# Patient Record
Sex: Female | Born: 1985
Health system: Southern US, Community
[De-identification: ages and names within clinical notes are randomized; demographics above are authoritative.]

## PROBLEM LIST (undated history)

## (undated) DIAGNOSIS — L309 Dermatitis, unspecified: Secondary | ICD-10-CM

## (undated) DIAGNOSIS — I493 Ventricular premature depolarization: Secondary | ICD-10-CM

## (undated) DIAGNOSIS — K219 Gastro-esophageal reflux disease without esophagitis: Secondary | ICD-10-CM

## (undated) DIAGNOSIS — R51 Headache: Secondary | ICD-10-CM

## (undated) DIAGNOSIS — O24419 Gestational diabetes mellitus in pregnancy, unspecified control: Secondary | ICD-10-CM

## (undated) DIAGNOSIS — Q210 Ventricular septal defect: Secondary | ICD-10-CM

## (undated) DIAGNOSIS — J309 Allergic rhinitis, unspecified: Secondary | ICD-10-CM

## (undated) DIAGNOSIS — A609 Anogenital herpesviral infection, unspecified: Secondary | ICD-10-CM

## (undated) HISTORY — DX: Headache: R51

## (undated) HISTORY — DX: Gestational diabetes mellitus in pregnancy, unspecified control: O24.419

## (undated) HISTORY — PX: CHOLECYSTECTOMY: SHX55

## (undated) HISTORY — DX: Ventricular premature depolarization: I49.3

## (undated) HISTORY — DX: Anogenital herpesviral infection, unspecified: A60.9

## (undated) HISTORY — DX: Gastro-esophageal reflux disease without esophagitis: K21.9

## (undated) HISTORY — DX: Allergic rhinitis, unspecified: J30.9

## (undated) HISTORY — DX: Dermatitis, unspecified: L30.9

## (undated) HISTORY — DX: Ventricular septal defect: Q21.0

---
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
## 2003-02-07 ENCOUNTER — Encounter: Payer: Self-pay | Admitting: Internal Medicine

## 2003-02-07 ENCOUNTER — Ambulatory Visit (HOSPITAL_COMMUNITY): Admission: RE | Admit: 2003-02-07 | Discharge: 2003-02-07 | Payer: Self-pay | Admitting: Internal Medicine

## 2003-06-06 ENCOUNTER — Encounter: Admission: RE | Admit: 2003-06-06 | Discharge: 2003-06-06 | Payer: Self-pay | Admitting: *Deleted

## 2003-06-06 ENCOUNTER — Ambulatory Visit (HOSPITAL_COMMUNITY): Admission: RE | Admit: 2003-06-06 | Discharge: 2003-06-06 | Payer: Self-pay | Admitting: *Deleted

## 2003-06-06 ENCOUNTER — Encounter: Payer: Self-pay | Admitting: Internal Medicine

## 2003-08-27 ENCOUNTER — Encounter (INDEPENDENT_AMBULATORY_CARE_PROVIDER_SITE_OTHER): Payer: Self-pay | Admitting: *Deleted

## 2003-08-27 ENCOUNTER — Ambulatory Visit (HOSPITAL_COMMUNITY): Admission: RE | Admit: 2003-08-27 | Discharge: 2003-08-27 | Payer: Self-pay | Admitting: *Deleted

## 2003-10-22 ENCOUNTER — Other Ambulatory Visit: Admission: RE | Admit: 2003-10-22 | Discharge: 2003-10-22 | Payer: Self-pay | Admitting: Obstetrics and Gynecology

## 2004-11-20 ENCOUNTER — Observation Stay (HOSPITAL_COMMUNITY): Admission: EM | Admit: 2004-11-20 | Discharge: 2004-11-21 | Payer: Self-pay | Admitting: Emergency Medicine

## 2004-11-21 ENCOUNTER — Encounter (INDEPENDENT_AMBULATORY_CARE_PROVIDER_SITE_OTHER): Payer: Self-pay | Admitting: *Deleted

## 2004-12-10 ENCOUNTER — Other Ambulatory Visit: Admission: RE | Admit: 2004-12-10 | Discharge: 2004-12-10 | Payer: Self-pay | Admitting: Obstetrics and Gynecology

## 2004-12-11 ENCOUNTER — Other Ambulatory Visit: Admission: RE | Admit: 2004-12-11 | Discharge: 2004-12-11 | Payer: Self-pay | Admitting: Obstetrics and Gynecology

## 2007-05-09 ENCOUNTER — Ambulatory Visit: Payer: Self-pay | Admitting: Family Medicine

## 2007-05-09 DIAGNOSIS — R519 Headache, unspecified: Secondary | ICD-10-CM | POA: Insufficient documentation

## 2007-05-09 DIAGNOSIS — Z8679 Personal history of other diseases of the circulatory system: Secondary | ICD-10-CM | POA: Insufficient documentation

## 2007-05-09 DIAGNOSIS — R51 Headache: Secondary | ICD-10-CM | POA: Insufficient documentation

## 2007-05-09 DIAGNOSIS — K219 Gastro-esophageal reflux disease without esophagitis: Secondary | ICD-10-CM | POA: Insufficient documentation

## 2007-05-12 ENCOUNTER — Encounter: Payer: Self-pay | Admitting: Family Medicine

## 2007-05-15 LAB — CONVERTED CEMR LAB
ALT: 15 units/L (ref 0–35)
AST: 22 units/L (ref 0–37)
Albumin: 3.6 g/dL (ref 3.5–5.2)
Alkaline Phosphatase: 71 units/L (ref 39–117)
BUN: 4 mg/dL — ABNORMAL LOW (ref 6–23)
Basophils Absolute: 0.1 10*3/uL (ref 0.0–0.1)
Basophils Relative: 0.7 % (ref 0.0–1.0)
Bilirubin, Direct: 0.1 mg/dL (ref 0.0–0.3)
CO2: 26 meq/L (ref 19–32)
Calcium: 9.5 mg/dL (ref 8.4–10.5)
Chloride: 102 meq/L (ref 96–112)
Creatinine, Ser: 0.6 mg/dL (ref 0.4–1.2)
Eosinophils Absolute: 0.3 10*3/uL (ref 0.0–0.6)
Eosinophils Relative: 3.3 % (ref 0.0–5.0)
GFR calc Af Amer: 159 mL/min
GFR calc non Af Amer: 132 mL/min
Glucose, Bld: 81 mg/dL (ref 70–99)
HCT: 38.7 % (ref 36.0–46.0)
Hemoglobin: 13.3 g/dL (ref 12.0–15.0)
Lymphocytes Relative: 25.3 % (ref 12.0–46.0)
MCHC: 34.5 g/dL (ref 30.0–36.0)
MCV: 87.1 fL (ref 78.0–100.0)
Monocytes Absolute: 0.7 10*3/uL (ref 0.2–0.7)
Monocytes Relative: 7.4 % (ref 3.0–11.0)
Neutro Abs: 5.7 10*3/uL (ref 1.4–7.7)
Neutrophils Relative %: 63.3 % (ref 43.0–77.0)
Platelets: 342 10*3/uL (ref 150–400)
Potassium: 4.9 meq/L (ref 3.5–5.1)
RBC: 4.44 M/uL (ref 3.87–5.11)
RDW: 11.6 % (ref 11.5–14.6)
Sodium: 137 meq/L (ref 135–145)
TSH: 1.62 microintl units/mL (ref 0.35–5.50)
Total Bilirubin: 0.4 mg/dL (ref 0.3–1.2)
Total Protein: 7 g/dL (ref 6.0–8.3)
WBC: 9.1 10*3/uL (ref 4.5–10.5)

## 2007-11-01 ENCOUNTER — Telehealth: Payer: Self-pay | Admitting: Family Medicine

## 2007-11-27 ENCOUNTER — Ambulatory Visit: Payer: Self-pay | Admitting: Family Medicine

## 2007-11-27 DIAGNOSIS — J309 Allergic rhinitis, unspecified: Secondary | ICD-10-CM | POA: Insufficient documentation

## 2008-03-13 ENCOUNTER — Telehealth: Payer: Self-pay | Admitting: Family Medicine

## 2009-09-16 ENCOUNTER — Ambulatory Visit: Payer: Self-pay | Admitting: Family Medicine

## 2009-09-16 DIAGNOSIS — R5383 Other fatigue: Secondary | ICD-10-CM

## 2009-09-16 DIAGNOSIS — R5381 Other malaise: Secondary | ICD-10-CM | POA: Insufficient documentation

## 2009-09-16 DIAGNOSIS — Q210 Ventricular septal defect: Secondary | ICD-10-CM | POA: Insufficient documentation

## 2009-09-16 DIAGNOSIS — L259 Unspecified contact dermatitis, unspecified cause: Secondary | ICD-10-CM | POA: Insufficient documentation

## 2009-09-16 LAB — CONVERTED CEMR LAB
Bilirubin Urine: NEGATIVE
Glucose, Urine, Semiquant: NEGATIVE
Ketones, urine, test strip: NEGATIVE
Urobilinogen, UA: 0.2
WBC Urine, dipstick: NEGATIVE
pH: 7

## 2009-09-18 LAB — CONVERTED CEMR LAB
ALT: 16 units/L (ref 0–35)
AST: 19 units/L (ref 0–37)
Alkaline Phosphatase: 53 units/L (ref 39–117)
Basophils Absolute: 0.5 10*3/uL — ABNORMAL HIGH (ref 0.0–0.1)
Basophils Relative: 4.9 % — ABNORMAL HIGH (ref 0.0–3.0)
Bilirubin, Direct: 0.1 mg/dL (ref 0.0–0.3)
CO2: 28 meq/L (ref 19–32)
GFR calc non Af Amer: 159.29 mL/min (ref >60)
Glucose, Bld: 99 mg/dL (ref 70–99)
HCT: 38 % (ref 36.0–46.0)
Hemoglobin: 12.8 g/dL (ref 12.0–15.0)
MCHC: 33.6 g/dL (ref 30.0–36.0)
MCV: 90 fL (ref 78.0–100.0)
Neutro Abs: 6 10*3/uL (ref 1.4–7.7)
Neutrophils Relative %: 60.4 % (ref 43.0–77.0)
Vitamin B-12: 257 pg/mL (ref 211–911)
WBC: 9.9 10*3/uL (ref 4.5–10.5)

## 2009-10-06 ENCOUNTER — Ambulatory Visit: Payer: Self-pay | Admitting: Internal Medicine

## 2010-04-14 ENCOUNTER — Encounter (INDEPENDENT_AMBULATORY_CARE_PROVIDER_SITE_OTHER): Payer: Self-pay | Admitting: *Deleted

## 2010-05-26 ENCOUNTER — Encounter: Payer: Self-pay | Admitting: Internal Medicine

## 2010-05-26 ENCOUNTER — Ambulatory Visit (HOSPITAL_COMMUNITY): Admission: RE | Admit: 2010-05-26 | Discharge: 2010-05-26 | Payer: Self-pay | Admitting: Internal Medicine

## 2010-05-26 ENCOUNTER — Ambulatory Visit: Payer: Self-pay | Admitting: Internal Medicine

## 2010-05-26 ENCOUNTER — Telehealth: Payer: Self-pay | Admitting: Internal Medicine

## 2010-05-26 ENCOUNTER — Telehealth (INDEPENDENT_AMBULATORY_CARE_PROVIDER_SITE_OTHER): Payer: Self-pay | Admitting: *Deleted

## 2010-05-26 ENCOUNTER — Ambulatory Visit: Payer: Self-pay

## 2010-05-27 ENCOUNTER — Telehealth: Payer: Self-pay | Admitting: Internal Medicine

## 2010-06-01 ENCOUNTER — Telehealth: Payer: Self-pay | Admitting: Internal Medicine

## 2010-06-01 ENCOUNTER — Telehealth (INDEPENDENT_AMBULATORY_CARE_PROVIDER_SITE_OTHER): Payer: Self-pay | Admitting: *Deleted

## 2010-06-02 ENCOUNTER — Ambulatory Visit: Payer: Self-pay | Admitting: Internal Medicine

## 2010-08-17 ENCOUNTER — Encounter: Payer: Self-pay | Admitting: Family Medicine

## 2010-08-27 NOTE — Procedures (Signed)
Summary: Summary Report  Summary Report   Imported By: Erle Crocker 08/03/2010 16:11:48  _____________________________________________________________________  External Attachment:    Type:   Image     Comment:   External Document

## 2010-08-27 NOTE — Progress Notes (Signed)
Summary: event monitor 06/02/10  Phone Note Outgoing Call Call back at [facility] Phone [phone]   Call placed by: Stanton Kidney, EMT-P,  June 01, 2010 1:39 PM Call placed to: Patient Action Taken: Phone Call Completed, Appt scheduled Summary of Call: event monitor scheduled11/8/11 at 4pm. 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

## 2010-08-27 NOTE — Assessment & Plan Note (Signed)
Summary: np6/congenital ventricular septal defect   Visit Type:  Initial Consult Primary Provider:  Nelwyn Salisbury MD  CC:  fatigue- pt has had some dizziness since she has been taking cold medicine.  History of Present Illness: Patient is a 25 year old with a history of a small VSD.  She was followed by Larinda Buttery in the past, last seen in 2005.  She is currently followed by Dr. Clent Ridges.  She is referred for continued cardiac evaluation On talking to the patient she denies chest pain.  Breathing is ok.  She has noted over the past year increased fatigue.  She is out of school and working .  When she gets out of work, she ust doesn't feel like doing anything.  Sleeps about  8 to 9 hrs per day.  Denies illnesses now or prior to feeling fatigued.  NO significant dizziness.  No syncope.  No palpitations.  Current Medications (verified): 1)  Sprintec 28 0.25-35 Mg-Mcg Tabs (Norgestimate-Eth Estradiol) .Marland Kitchen.. 1 By Mouth Once Daily 2)  Triamcinolone Acetonide 0.1 % Lotn (Triamcinolone Acetonide) .... Apply Three Times A Day As Needed Rash 3)  Alka-Seltzer Plus Cold 2-7.8-325 Mg Tbef (Chlorphen-Phenyleph-Asa) .... Pt Has A Cold At This Present Time  Allergies (verified): 1)  ! 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  Negative to the above problem except as noted above.  Vital Signs:  Patient profile:   25 year old female Height:      63  inches Weight:      112 pounds BMI:     19.91 Pulse rate:   92 / minute Pulse (ortho):   89 / minute BP standing:   135 / 93 Cuff size:   regular  Vitals Entered By: Burnett Kanaris, CNA (October 06, 2009 10:53 AM)  Serial Vital Signs/Assessments:  Time      Position  BP       Pulse  Resp  Temp     By      Lying LA  135/93   91                    Burnett Kanaris, CNA      Sitting   132/93   95                    Burnett Kanaris, CNA      Standing  135/93   89                    Burnett Kanaris, CNA      Standing  139/93   97                    Burnett Kanaris, CNA  Comments: no dizziness  By: Burnett Kanaris, CNA  pt states she had a lttle dizziness last couple of days but she thinks it is from the cold she has. She is not having any now. By: Burnett Kanaris, CNA  Physical Exam  Additional Exam:  Patient is in NAD HEENT:  Normocephalic, atraumatic. EOMI, PERRLA.  Neck: JVP is normal. No thyromegaly. No bruits.  Lungs: clear to auscultation. No rales no wheezes.  Heart: Regular rate and rhythm. Normal S1, S2. No S3.  Gr III-IV /VI systolic murmur L sternal border.  No diastolic murmurs. PMI not displaced.  Abdomen:  Supple, nontender. Normal bowel sounds. No masses. No hepatomegaly.  Navel pierced.  Extremities:   Good distal pulses throughout. No lower extremity edema.  Musculoskeletal :moving all extremities.  Neuro:   alert and oriented x3.    EKG  Procedure date:  10/06/2009  Findings:      NSR>  92 bpm.  RBBB.  Impression & Recommendations:  Problem # 1:  VENTRICULAR SEPTAL DEFECT (ICD-745.4) Patient has a small perimembranous VSD.  Last echo was 2005. On examination today there is no diasotlic murmur to suggest AV prolapse and AI. Still, I would recomm an echo to reconfirm. Otherwise, continue ABx prophylacitically. F/U in 2 years. Orders: EKG w/ Interpretation (93000) Echocardiogram (Echo)  Problem # 2:  FATIGUE (ICD-780.79) I do  not think  the patient's fatigue is due to her cardiac abnormality.  She is not orthostatic on exam. Encouraged her to stay active.  Patient Instructions: 1)  Your physician has requested that you have an echocardiogram.  Echocardiography is a painless test that uses sound waves to create images of your heart. It provides your doctor with information about the size and shape of your heart and how well your heart's chambers and valves are working.  This procedure takes approximately one hour. There are no restrictions for this procedure. 2)  Your physician wants you to follow-up in:  2 years. You will receive a reminder letter in the mail two months in advance. If you don't receive a letter, please call our office to schedule the follow-up appointment.  Appended Document: np6/congenital ventricular septal defect Patient cancelled her echo that was scheduled for 3/30..Called patient and left message on machine to call back to reschedule.

## 2010-08-27 NOTE — Assessment & Plan Note (Signed)
Summary: Event Monitor

## 2010-08-27 NOTE — Progress Notes (Signed)
Summary: heart racing  Phone Note Outgoing Call   Call placed by: Meredith Staggers, RN,  May 26, 2010 4:09 PM Call placed to: Patient Summary of Call: called pt regarding her mess of racing heart rate.  She states that she has had 2 episodes.  The 1st happened when she was at work she felt like her heart was racing she was dizzy and felt like she was going to pass out, she layed down in the floor and rested, she states it lasted about 30 min and then she felt better.  The 2nd episode happened about a week ago, she was laying in bed and felt her heart start to race her mom came in and checked her VS she says her BP was good and her pulse was in the 90s, she states this episode lasted about 1-1 & 1/2 hours and then resolved on its own.  She couldn't pin point any cause, she felt after 1st episode she thought could have been from stress but she was not stressed and was relaxing at 2nd episode.  She did have echo today, results are not back yest.  Will send all info to Dr Tenny Craw for review   Follow-up for Phone Call        Echo looks good. I am not sure what racing represents.  I would set patient up for an event montior. Tried to call but no answer. Follow-up by: Sherrill Raring, MD, [facility],  May 27, 2010 12:44 PM     Appended Document: heart racing pt states that she has not had another episode since last week. Spoke w/her about the event monitor and she would like to procede with that. Claris Gladden, RN, BSN

## 2010-08-27 NOTE — Progress Notes (Signed)
Summary: event monitor- unable to schedule  Phone Note Outgoing Call   Call placed by: Stanton Kidney, EMT-P,  June 01, 2010 12:21 PM Summary of Call: Unable to reach pt. to schedule for 30 day event monitor (NO working numbers in chart). Stanton Kidney, EMT-P  June 01, 2010 12:21 PM

## 2010-08-27 NOTE — Letter (Signed)
Summary: Appointment - Missed  [facility], Main Office  [address]   [place], [place] [address]   Phone: [phone]  Fax: [phone]     April 14, 2010 MRN: [id number]   [facility] [address] [place], [place]  [address]   Dear Ms. PERMAR,  Our records indicate you missed your appointment on  10/22/09 to have a Echo .It is very important that we reach you to reschedule this appointment. We look forward to participating in your health care needs. Please contact us at the number listed above at your earliest convenience to reschedule this appointment.     Sincerely,  Artist

## 2010-08-27 NOTE — Progress Notes (Signed)
Summary: pt rtn your call  Phone Note Call from Patient   Caller: Patient Reason for Call: Talk to Nurse, Talk to Doctor Summary of Call: pt rtn your call Initial call taken by: Omer Jack,  June 01, 2010 12:22 PM  Follow-up for Phone Call        monitor scheduled11/8. Follow-up by: Stanton Kidney, EMT-P,  June 01, 2010 1:41 PM

## 2010-08-27 NOTE — Progress Notes (Signed)
Summary: returned call  Phone Note Call from Patient   Caller: Patient [phone] Reason for Call: Talk to Nurse Summary of Call: pt returned call from yesterday Initial call taken by: Glynda Jaeger,  May 27, 2010 1:11 PM  Follow-up for Phone Call        spoke w/pt and see notes under previous call.  Follow-up by: Claris Gladden RN,  May 27, 2010 1:27 PM

## 2010-08-27 NOTE — Progress Notes (Signed)
  Walk in Patient Form Recieved Pt left Message " Heart Racing,Shaking,Feels Faint" sent to Message Nurse. [facility]  May 26, 2010 9:08 AM.

## 2010-08-27 NOTE — Assessment & Plan Note (Signed)
Summary: CONSTANT FATIGUE // RS   Vital Signs:  Patient profile:   25 year old female Weight:      112 pounds BMI:     19.91 Temp:     98.5 degrees F oral BP sitting:   102 / 68  (left arm) Cuff size:   regular  Vitals Entered By: Alfred Levins, CMA (September 16, 2009 3:32 PM) CC: fatigue, rash on rt hand and feet   History of Present Illness: Here with her mother to discuss several issues. First she has felt chronic fatigue for the past year. This does not stop her from working or taking care of daily activities, but she often feels the need to take naps or go to bed early. She sleeps well and averages 8-9 hours a night. Her only med is her BCP. Second, she has had an itchy rash over her hands and feet for several weeks. using Benadryl lotion. Third, she has a congenital VSD, and she used to see Dr.  Candis Musa every 2-3 years. Now she would like to see an adult cardiologist. She has no symptoms from this, no chest pains, no SOB.   Current Medications (verified): 1)  Sprintec 28 0.25-35 Mg-Mcg Tabs (Norgestimate-Eth Estradiol) .Marland Kitchen.. 1 By Mouth Once Daily  Allergies (verified): 1)  ! Phenergan  Past History:  Past Medical History: Headache ventricular septal defect, Takes amoxicillin prior to dental appts. GERD sees Dr. Henderson Cloud for gyn exams  Past Surgical History: Reviewed history from 05/09/2007 and no changes required. Cholecystectomy 2006  Review of Systems  The patient denies anorexia, fever, weight loss, weight gain, vision loss, decreased hearing, hoarseness, chest pain, syncope, dyspnea on exertion, peripheral edema, prolonged cough, headaches, hemoptysis, abdominal pain, melena, hematochezia, severe indigestion/heartburn, hematuria, incontinence, genital sores, muscle weakness, suspicious skin lesions, transient blindness, difficulty walking, depression, unusual weight change, abnormal bleeding, enlarged lymph nodes, angioedema, breast masses, and testicular masses.     Physical Exam  General:  Well-developed,well-nourished,in no acute distress; alert,appropriate and cooperative throughout examination Neck:  No deformities, masses, or tenderness noted. Lungs:  Normal respiratory effort, chest expands symmetrically. Lungs are clear to auscultation, no crackles or wheezes. Heart:  Normal rate and regular rhythm. S1 and S2 normal without gallop, click, or rub. Has a loud harsh 3/6 holosystolic murmur loudest at the left sternal border Abdomen:  Bowel sounds positive,abdomen soft and non-tender without masses, organomegaly or hernias noted. Neurologic:  alert & oriented X3, cranial nerves II-XII intact, and gait normal.   Skin:  patches of red, macular, scaly skin on the hands and feet   Impression & Recommendations:  Problem # 1:  FATIGUE (ICD-780.79)  Orders: UA Dipstick w/o Micro (automated)  (81003) Venipuncture ([address]) TLB-BMP (Basic Metabolic Panel-BMET) (80048-METABOL) TLB-CBC Platelet - w/Differential (85025-CBCD) TLB-Hepatic/Liver Function Pnl (80076-HEPATIC) TLB-TSH (Thyroid Stimulating Hormone) (84443-TSH) TLB-B12, Serum-Total ONLY (60454-U98) T-Vitamin D (25-Hydroxy) (11914-78295) Cardiology Referral (Cardiology)  Problem # 2:  ECZEMA (ICD-692.9)  Her updated medication list for this problem includes:    Triamcinolone Acetonide 0.1 % Lotn (Triamcinolone acetonide) .Marland Kitchen... Apply three times a day as needed rash  Problem # 3:  VENTRICULAR SEPTAL DEFECT (ICD-745.4)  Complete Medication List: 1)  Sprintec 28 0.25-35 Mg-mcg Tabs (Norgestimate-eth estradiol) .Marland Kitchen.. 1 by mouth once daily 2)  Triamcinolone Acetonide 0.1 % Lotn (Triamcinolone acetonide) .... Apply three times a day as needed rash  Patient Instructions: 1)  Get labs to investigate the fatigue. Try triamcinolone on the rash. Refer her to Dr. Dietrich Pates for the VSD.  Prescriptions: TRIAMCINOLONE ACETONIDE 0.1 % LOTN (TRIAMCINOLONE ACETONIDE) apply three times a day as needed rash   #60 x 5   Entered and Authorized by:   Nelwyn Salisbury MD   Signed by:   Nelwyn Salisbury MD on 09/16/2009   Method used:   Electronically to        [facility]. [phone]* (retail)       [address]       [place], [place]  [address]       Ph: [phone]       Fax: [phone]   RxID:   [id number]   Laboratory Results   Urine Tests  Date/Time Recieved: September 16, 2009 4:43 PM  Date/Time Reported: September 16, 2009 4:43 PM   Routine Urinalysis   Color: yellow Appearance: Clear Glucose: negative   (Normal Range: Negative) Bilirubin: negative   (Normal Range: Negative) Ketone: negative   (Normal Range: Negative) Spec. Gravity: 1.015   (Normal Range: 1.003-1.035) Blood: trace-intact   (Normal Range: Negative) pH: 7.0   (Normal Range: 5.0-8.0) Protein: negative   (Normal Range: Negative) Urobilinogen: 0.2   (Normal Range: 0-1) Nitrite: negative   (Normal Range: Negative) Leukocyte Esterace: negative   (Normal Range: Negative)    Comments: Wynona Canes, CMA  September 16, 2009 4:43 PM

## 2010-09-28 ENCOUNTER — Ambulatory Visit: Payer: Self-pay | Admitting: Family Medicine

## 2010-12-11 NOTE — H&P (Signed)
Brandi Wagner, Brandi Wagner               ACCOUNT NO.:  [id number]   MEDICAL RECORD NO.:  [id number]          PATIENT TYPE:  INP   LOCATION:  5703                         FACILITY:  [facility]   PHYSICIAN:  Cherylynn Ridges, M.D.    DATE OF BIRTH:  [birth year]   DATE OF ADMISSION:  11/20/2004  DATE OF DISCHARGE:                                HISTORY & PHYSICAL   CHIEF COMPLAINT:  The patient is a 25 year old with abdominal pain in the  epigastrium and right upper quadrant who has gallstones on examination.   HISTORY OF PRESENT ILLNESS:  The patient has had the acute onset of  abdominal pain in the left lower quadrant epigastrium associated with  nausea. She has had pains like this before but had previously been diagnosed  as gastroenteritis or just an upset stomach. She came to the emergency room  and ultrasound was done which showed gallstones. Surgical consultation was  obtained. The patient also has abnormal liver function test which make this  suspicious for acute cholecystitis or biliary colic.   PAST MEDICAL HISTORY:  Only for a VSD of which she has a loud murmur. She  takes antibiotics for a dental procedure.   MEDICATIONS:  She takes no chronic medications.   ALLERGIES:  She has no known drug allergies.   PAST SURGICAL HISTORY:  No previous surgeries.   FAMILY HISTORY:  She does have a family history of gallstones with a  maternal grandmother who has had cholecystectomy in the past. She is  otherwise healthy and has no physical limitations secondary to her VSD.   REVIEW OF SYMPTOMS:  Her last bowel movement was yesterday and she is  otherwise healthy.   PHYSICAL EXAMINATION:  VITAL SIGNS:  She is afebrile. Vital signs are  stable.  HEENT:  She is normocephalic and atraumatic. Anicteric.  NECK:  Supple.  CHEST:  Clear.  CARDIOVASCULAR:  She has a very loud 4/6 murmur at the left lower sternal  border that radiates to the neck and to the axilla. She has a palpable  thrill  associated with that.  ABDOMEN:  Tender in the epigastrium, while only mildly so with no rebound or  guarding. She has an active bowel sounds. She has umbilical piercing with  jewelry in place.  RECTAL:  Deferred.  PELVIC:  Deferred.   LABORATORY DATA:  A urine HCG is negative. Fibrin degradation products are  within normal limits. A white count is 14,000. Hemoglobin 13.6, hematocrit  39.5, platelets are 374,000. Her sodium 139, potassium 3.5, chloride 106,  CO2 26, glucose 86, BUN 5, creatinine 0.6. Her SGOT is elevated at 205. Her  SGPT is elevated at 61, alkaline phosphate is within normal limits at 97.  Total bilirubin is 0.4. Her myoglobin and CK and MB are all within normal  limits. Amylase is 106 with upper limits of normal being 131,000. Lipase is  normal.   IMPRESSION:  Likely biliary colic and/or mild subacute cholecystitis in an  otherwise healthy female. She is very thin. She does not fit the normal  profile with a patient with cholelithiasis; however,  with a history of a  loud heart murmur there is a chance she could have some hemoglobin  degradation products leading into tegumented stones. This could also lead to  cholelithiasis and her symptomatic gallbladder. The patient wishes to have a  procedure done in the morning. At this time, I can make no argument for an  emergency case and as she is not septic and her pain is well controlled. We  will go ahead with a laparoscopic cholecystectomy. She will get Unasyn on  call to the OR. The patient understands the risks and benefits and wishes to  proceed. We have asked her to please remove her umbilical jewelry.      JOW/MEDQ  D:  11/20/2004  T:  11/21/2004  Job:  [id number]

## 2010-12-11 NOTE — Op Note (Signed)
Brandi Wagner, Brandi Wagner               ACCOUNT NO.:  [id number]   MEDICAL RECORD NO.:  [id number]          PATIENT TYPE:  INP   LOCATION:  5703                         FACILITY:  [facility]   PHYSICIAN:  Cherylynn Ridges, M.D.    DATE OF BIRTH:  12/30/1985   DATE OF PROCEDURE:  11/21/2004  DATE OF DISCHARGE:                                 OPERATIVE REPORT   PREOP DIAGNOSIS:  Symptomatic cholelithiasis and biliary colic.   POSTOP DIAGNOSIS:  Symptomatic cholelithiasis and biliary colic.   PROCEDURE:  Laparoscopic cholecystectomy with cholangiogram.   SURGEON:  Cherylynn Ridges, M.D.   ANESTHESIA:  General endotracheal.   ESTIMATED BLOOD LOSS:  Less than 20 mL.   COMPLICATIONS:  None.   CONDITION:  Good.   INDICATIONS FOR OPERATION:  The patient is a 25 year old female with  abdominal pain in the right upper quadrant with abnormal liver function  tests who now comes in for laparoscopic cholecystectomy.   FINDINGS:  The patient had a normal cholangiogram and only mild inflammation  of the gallbladder.   OPERATION:  The patient was taken to the operating room, placed on table in  supine position. After an adequate endotracheal general anesthetic was  administered, she was prepped and draped in usual sterile manner exposing  the midline in the right upper quadrant.  An infraumbilical curvilinear  incision was made using #11 blade and taking down to the midline fascia. It  was through this midline fascia that a Veress needle was passed into the  peritoneal cavity, up into the peritoneal cavity. It was confirmed to be in  position using the saline test. Once this was done, carbon dioxide  insufflation was instilled into the peritoneal cavity up to a maximal intra-  abdominal pressure of 15 mmHg. Once this was done, OptiVu, a 10-mm cannula,  with the inserted laparoscope and attached camera was passed through the  infraumbilical fascia into the peritoneal cavity and confirmed to be in  position with the scope. Two right costal margin 5-mm cannulas were passed  under direct vision and a subxiphoid 11/12-mm cannula passed under direct  vision.  A 30-degree scope was used.  The patient was placed in reverse  Trendelenburg position.  Her left side was tilted down and the dissection  begun.   The dome of the gallbladder was grasped using a ratcheted grasper and a  second was placed on the infundibulum.  This allowed [name] to identify the  peritoneum overlying the triangle of Fallot and the hepatoduodenal triangle.  We incised this peritoneum with electrocautery and then dissected out the  cystic duct and cystic artery. Once an adequate window had been dissected  from the cystic duct, we passed an endoclip on the gallbladder side of the  duct; made a cholecystodochotomy using endoscopic scissors; and then passed  a Cook catheter in through the anterior abdominal wall and into the  cholecystodochotomy to perform cholangiogram. This demonstrated good flow to  the duodenum, no obstruction, no dilatation, no filling defects.   We subsequently removed the cannula and then endoclipped the cystic duct  distally times  double clips. We transected the cystic duct, dissected out  the cystic artery, which was endoclipped distally x2; and then transected up  against the gallbladder. We then dissected out the gallbladder, from its  bed, with minimal difficulty without entrance into the gallbladder. We then  freed the gallbladder from the infraumbilical site with minimal difficulty.  Once this was done we irrigated and cauterized the bed for adequate  hemostasis. Once we had adequate hemostasis we irrigated and removed all  cannulas and aspirated all gas.   The infraumbilical fascia was closed using a figure-of-eight stitch of #0  Vicryl passed on a UR-6 needle. We used 1/4% Marcaine with epinephrine at  all sites approximately 18 mL were used. The skin was closed using a running   subcuticular stitch of 4-0 Vicryl. Sterile dressings were applied. This  included Steri-Strips and Betadine ointment.      JOW/MEDQ  D:  11/21/2004  T:  11/22/2004  Job:  [id number]

## 2011-02-02 ENCOUNTER — Other Ambulatory Visit: Payer: Self-pay | Admitting: Obstetrics and Gynecology

## 2011-11-15 ENCOUNTER — Telehealth: Payer: Self-pay | Admitting: Internal Medicine

## 2011-11-15 NOTE — Telephone Encounter (Signed)
Pt left voice mail requesting her to call to schedule appt/ that was due in March/glc

## 2011-11-16 ENCOUNTER — Telehealth: Payer: Self-pay | Admitting: Internal Medicine

## 2011-11-16 NOTE — Telephone Encounter (Signed)
4/23 called patient at wk, she will call back to sched appnt. Brandi Wagner

## 2012-04-26 ENCOUNTER — Encounter: Payer: Self-pay | Admitting: Family Medicine

## 2012-04-26 ENCOUNTER — Ambulatory Visit (INDEPENDENT_AMBULATORY_CARE_PROVIDER_SITE_OTHER): Payer: BC Managed Care – PPO | Admitting: Family Medicine

## 2012-04-26 VITALS — BP 102/70 | HR 78 | Temp 98.6°F | Wt 115.0 lb

## 2012-04-26 DIAGNOSIS — R82998 Other abnormal findings in urine: Secondary | ICD-10-CM

## 2012-04-26 DIAGNOSIS — R109 Unspecified abdominal pain: Secondary | ICD-10-CM

## 2012-04-26 DIAGNOSIS — R8271 Bacteriuria: Secondary | ICD-10-CM

## 2012-04-26 DIAGNOSIS — M545 Low back pain, unspecified: Secondary | ICD-10-CM

## 2012-04-26 LAB — POCT URINALYSIS DIPSTICK
Ketones, UA: NEGATIVE
Urobilinogen, UA: 0.2
pH, UA: 6.5

## 2012-04-26 NOTE — Progress Notes (Signed)
  Subjective:    Patient ID: Brandi Wagner, female    DOB: 1985/09/14, 26 y.o.   MRN: [id number]  HPI Here for several weeks of stiffness and pain in the lower back. No hx of trauma, but she thinks it got worse after standing at a football game for several hours. Tylenol and Advil help. No pain in the legs. She is concerned about a possible UTI, although she denies any burning or urgency. No fever or nausea. u   Review of Systems  Constitutional: Negative.   Respiratory: Negative.   Cardiovascular: Negative.   Gastrointestinal: Negative.   Genitourinary: Negative.   Musculoskeletal: Positive for back pain.       Objective:   Physical Exam  Constitutional: She appears well-developed and well-nourished.  Musculoskeletal:       There is spasm of the paraspinal muscles in the lower back with tenderness. ROM is reduced. No scoliosis.           Assessment & Plan:  Low back pain. Try heat, stretches, and Aleve bid prn. She should try core strengthening exercises like Pilates or yoga. I do not think she has a UTI, but we will get a urine culture to be sure.

## 2012-04-28 LAB — URINE CULTURE: Colony Count: 10000

## 2012-04-28 NOTE — Progress Notes (Signed)
Quick Note:  I left voice message with results. ______ 

## 2012-05-10 ENCOUNTER — Other Ambulatory Visit: Payer: Self-pay

## 2012-05-16 ENCOUNTER — Encounter: Payer: Self-pay | Admitting: Family Medicine

## 2012-10-30 ENCOUNTER — Ambulatory Visit (INDEPENDENT_AMBULATORY_CARE_PROVIDER_SITE_OTHER): Payer: BC Managed Care – PPO | Admitting: Family Medicine

## 2012-10-30 ENCOUNTER — Encounter: Payer: Self-pay | Admitting: Family Medicine

## 2012-10-30 VITALS — BP 120/80 | HR 85 | Temp 99.3°F | Wt 114.0 lb

## 2012-10-30 DIAGNOSIS — R5381 Other malaise: Secondary | ICD-10-CM

## 2012-10-30 DIAGNOSIS — R202 Paresthesia of skin: Secondary | ICD-10-CM

## 2012-10-30 DIAGNOSIS — R209 Unspecified disturbances of skin sensation: Secondary | ICD-10-CM

## 2012-10-30 DIAGNOSIS — R0789 Other chest pain: Secondary | ICD-10-CM

## 2012-10-30 LAB — POCT URINALYSIS DIPSTICK
Bilirubin, UA: NEGATIVE
Glucose, UA: NEGATIVE
Ketones, UA: NEGATIVE
Leukocytes, UA: NEGATIVE
Nitrite, UA: NEGATIVE
Protein, UA: NEGATIVE
Urobilinogen, UA: 0.2

## 2012-10-30 LAB — CBC WITH DIFFERENTIAL/PLATELET
Eosinophils Absolute: 0.1 10*3/uL (ref 0.0–0.7)
HCT: 41.7 % (ref 36.0–46.0)
Lymphocytes Relative: 28.6 % (ref 12.0–46.0)
Lymphs Abs: 2.3 10*3/uL (ref 0.7–4.0)
Monocytes Absolute: 0.7 10*3/uL (ref 0.1–1.0)
Neutro Abs: 4.9 10*3/uL (ref 1.4–7.7)
Platelets: 363 10*3/uL (ref 150.0–400.0)
WBC: 8 10*3/uL (ref 4.5–10.5)

## 2012-10-30 LAB — BASIC METABOLIC PANEL
CO2: 27 mEq/L (ref 19–32)
Chloride: 102 mEq/L (ref 96–112)
Potassium: 4.6 mEq/L (ref 3.5–5.1)
Sodium: 140 mEq/L (ref 135–145)

## 2012-10-30 LAB — TSH: TSH: 1.15 u[IU]/mL (ref 0.35–5.50)

## 2012-10-30 LAB — HEPATIC FUNCTION PANEL: ALT: 17 U/L (ref 0–35)

## 2012-10-30 LAB — VITAMIN B12: Vitamin B-12: 490 pg/mL (ref 211–911)

## 2012-10-30 NOTE — Progress Notes (Signed)
  Subjective:    Patient ID: Brandi Wagner, female    DOB: Nov 14, 1985, 27 y.o.   MRN: [id number]  HPI Here for fatigue and numbness. She has felt very fatigued for several months with no clear reason why. Then 2 weeks ago she developed numbness and tingling in both hands. Then yesterday she had some numbness in the left upper arm, and this am she had numbness and tingling in the left foot. Most of the numbness in her hands is in the 4th and 5th fingers, but at times the entire hands are involved. No pain or weakness. She denies chest pain or SOB. No fevers. Her hands swell a little but not the feet. She has been on her current BCP for about 18 months.    Review of Systems  Constitutional: Positive for fatigue. Negative for activity change, appetite change and unexpected weight change.  Respiratory: Negative.   Cardiovascular: Negative.   Gastrointestinal: Negative.   Neurological: Positive for numbness. Negative for dizziness, tremors, seizures, syncope, facial asymmetry, speech difficulty, weakness, light-headedness and headaches.       Objective:   Physical Exam  Constitutional: She is oriented to person, place, and time. She appears well-developed and well-nourished. No distress.  Neck: No thyromegaly present.  Cardiovascular: Normal rate, regular rhythm and intact distal pulses.  Exam reveals no gallop and no friction rub.   Has her usual 3/6 SM over the sternum. The EKG shows sinus with her baseline RBBB  Pulmonary/Chest: Effort normal and breath sounds normal.  Musculoskeletal: She exhibits no edema.  Lymphadenopathy:    She has no cervical adenopathy.  Neurological: She is alert and oriented to person, place, and time. She has normal reflexes. No cranial nerve deficit. She exhibits normal muscle tone. Coordination normal.          Assessment & Plan:  Fatigue and paresthesias of uncertain origin, things like B12 deficiency and anemia are possible. Get labs today. She is due  to follow up with Dr. Tenny Craw as well

## 2012-10-31 ENCOUNTER — Telehealth: Payer: Self-pay | Admitting: Internal Medicine

## 2012-10-31 ENCOUNTER — Telehealth: Payer: Self-pay | Admitting: Family Medicine

## 2012-10-31 DIAGNOSIS — R202 Paresthesia of skin: Secondary | ICD-10-CM

## 2012-10-31 LAB — VITAMIN D 25 HYDROXY (VIT D DEFICIENCY, FRACTURES): Vit D, 25-Hydroxy: 37 ng/mL (ref 30–89)

## 2012-10-31 NOTE — Telephone Encounter (Signed)
New problem    C/O for about a week.   tingling in right & left pink finger. Elbow to hand are numb.   Today her arm still numb. Tightness in chest. Saw Dr. Clent Ridges on yesterday.

## 2012-10-31 NOTE — Telephone Encounter (Signed)
Tell her I have referred her to Neurology for the paresthesias

## 2012-10-31 NOTE — Telephone Encounter (Signed)
Caller: Vanesa/Patient; Phone: [phone]; Reason for Call: Patient calling in follow up to visit 10/30/12.  States wants to know lab results and next steps.  Per Epic, patient seen for numbness in arms and hands and feet as well as chest discomfort x 2 weeks, for which she was follow up with cardiology/Dr.  Tenny Craw.  States is awaiting appt with Dr.  Tenny Craw.  ECG normal in office 10/30/12.  Per Epic, results not all in system as yet; all results available state not released to patient.  Info to office for provider/staff review/callback.  May reach patient at [phone].  Krs/can

## 2012-10-31 NOTE — Telephone Encounter (Signed)
I spoke with pt and gave results.  

## 2012-10-31 NOTE — Telephone Encounter (Signed)
I spoke with pt  

## 2012-10-31 NOTE — Progress Notes (Signed)
Quick Note:  I spoke with pt and gave results, she would like to know what the next step is, since she is still having same problem? ______

## 2012-10-31 NOTE — Telephone Encounter (Signed)
Pt calling to inquire about her blood work results from yesterday. Please assist.

## 2012-11-01 NOTE — Telephone Encounter (Signed)
appt made to see Dr. Tenny Craw.

## 2012-11-02 NOTE — Progress Notes (Signed)
Quick Note:  I left voice message with below information. ______

## 2012-11-06 ENCOUNTER — Encounter: Payer: Self-pay | Admitting: Neurology

## 2012-11-06 ENCOUNTER — Ambulatory Visit (INDEPENDENT_AMBULATORY_CARE_PROVIDER_SITE_OTHER): Payer: BC Managed Care – PPO | Admitting: Neurology

## 2012-11-06 VITALS — BP 98/60 | HR 70 | Temp 98.2°F | Resp 12 | Ht 63.5 in | Wt 114.0 lb

## 2012-11-06 DIAGNOSIS — R209 Unspecified disturbances of skin sensation: Secondary | ICD-10-CM

## 2012-11-06 DIAGNOSIS — R202 Paresthesia of skin: Secondary | ICD-10-CM

## 2012-11-06 DIAGNOSIS — M79609 Pain in unspecified limb: Secondary | ICD-10-CM

## 2012-11-06 DIAGNOSIS — M79601 Pain in right arm: Secondary | ICD-10-CM

## 2012-11-06 NOTE — Progress Notes (Signed)
Brandi Wagner is a 27 year old woman with a family history of cerebral aneurysm in her mother and her maternal grandfather.  Her mother had the aneurysm clipping and she was 67, and care he had an MRI of the brain and MRA when she was 67.  She did not have any evidence of aneurysm at that time. In the last few weeks, she has developed numbness of the hands that is intermittent.  As time has gone on, it seems that the numbness is primarily in the fourth and fifth fingers of the hands, more on the left than the right. She also gets pain in the forearms and sometimes the upper arms associated with this.  The numbness is like a pins and needles as well as a numbness and has become almost continuous.  It does not wake her up at night. She does tend to lean her elbow on the desk and on the car window or arm rests and the car frequently.  She does this more often on the left side, and that is the side more symptoms.  She is also had fatigue for a few years.  She also has occasional back pain.  She did have one episode where her left foot seem to go numb and this was associated with an episode of back pain.  This has not happened frequently.  She also has some headaches in the back part of her head.  She had a few episodes of brief visual disturbance, possibly associated with the headaches.    Also, she had one episode in which the left upper lip went numb.  She as this was after doing her symptoms and sitting all kinds of diagnoses out there.  Review of systems is positive for cold bluish feet, tension in the neck, back pain, occasional headaches, occasional blurred vision and general feeling of stress.  Remainder of review of systems is negative.  Past Medical History  Diagnosis Date  . GERD (gastroesophageal reflux disease)   . Headache   . Ventricular septal defect     sees Dr. Dietrich Pates     Family History  Problem Relation Age of Onset  . Hypertension      Current Outpatient Prescriptions on File Prior  to Visit  Medication Sig Dispense Refill  . norgestimate-ethinyl estradiol (ORTHO-CYCLEN,SPRINTEC,PREVIFEM) 0.25-35 MG-MCG tablet Take 1 tablet by mouth daily.       No current facility-administered medications on file prior to visit.   Promethazine hcl allergy History   Social History  . Marital Status: Single    Spouse Name: N/A    Number of Children: N/A  . Years of Education: N/A   Occupational History  . Not on file.   Social History Main Topics  . Smoking status: Never Smoker   . Smokeless tobacco: Never Used  . Alcohol Use: 1.0 oz/week    2 drink(s) per week     Comment: weekends  . Drug Use: Yes    Special: Marijuana  . Sexually Active: Not on file   Other Topics Concern  . Not on file   Social History Narrative  . No narrative on file    BP 98/60  Pulse 70  Temp(Src) 98.2 F (36.8 C)  Resp 12  Ht 5' 3.5" (1.613 m)  Wt 114 lb (51.71 kg)  BMI 19.87 kg/m2   Alert and oriented x 3.  Memory function appears to be intact.  Concentration and attention are normal for educational level and background.  Speech is fluent  and without significant word finding difficulty.  Is aware of current events.  No carotid bruits detected.  Cranial nerve II through XII are within normal limits.  This includes normal optic discs and acuity, EOMI, PERLA, facial movement and sensation intact, hearing grossly intact, gag intact,Uvula raises symmetrically and tongue protrudes evenly. Motor strength is 5 over 5 throughout all limbs.  No atrophy, abnormal tone or tremors. Reflexes are 2+ and symmetric in the upper and lower extremities Sensory exam is intact. Coordination is intact for fine movements and rapid alternating movements in all limbs Gait and station are normal.   The ulnar nerves are somewhat tender and do reproduce symptoms of paresthesias in the fingers with mild to moderate pressure.   Impression: 1. Bilateral ulnar distribution numbness.  If these are the only  symptoms, I would probably suspect ulnar neuropathy.  However, she has enough other possible related symptoms such as numbness in the lip, occipital headaches, blurred vision and one episode of numbness in the left foot that I would like to first do diagnostic tests to rule out other conditions such as MS.  Plan: 1. MRI of the brain and MRI of the cervical spine to rule out a mass or cervical cord syndromes. 2. Get elbow support braces one size  too large that will help remind her not to lean her elbows or forearms on the desk or the arm rest of the car 3. Return in 2 weeks to review MRI results.  If they're negative, we will consider nerve conduction study to rule out ulnar neuropathy.

## 2012-11-06 NOTE — Patient Instructions (Addendum)
Your MRI is scheduled at [facility] on Friday, April 18th at 2:00 pm.  Please check in at the first floor radiology department 15 minutes prior to your scheduled appointment time.    [phone].  Follow up with Dr. Smiley Houseman in 2 weeks.

## 2012-11-10 ENCOUNTER — Ambulatory Visit (HOSPITAL_COMMUNITY)
Admission: RE | Admit: 2012-11-10 | Discharge: 2012-11-10 | Disposition: A | Discharge status: Home / Self Care | Payer: BC Managed Care – PPO | Source: Ambulatory Visit | Attending: Neurology | Admitting: Neurology

## 2012-11-10 DIAGNOSIS — R202 Paresthesia of skin: Secondary | ICD-10-CM

## 2012-11-10 DIAGNOSIS — M79601 Pain in right arm: Secondary | ICD-10-CM

## 2012-11-10 DIAGNOSIS — R209 Unspecified disturbances of skin sensation: Secondary | ICD-10-CM | POA: Insufficient documentation

## 2012-11-13 ENCOUNTER — Telehealth: Payer: Self-pay | Admitting: Neurology

## 2012-11-13 NOTE — Telephone Encounter (Signed)
Per Call-a-nurse message - pt called regarding MRI scheduled for 11/10/12. She states she was offered Xanax but did not take the med. Asked if she could take Benadryl. Pt will follow box directions on Benadryl. Pt was advised that call-a-nurse could not call in a RX after hours and would forward message to the office. Pt expressed understanding. / Roanna Raider

## 2012-11-15 ENCOUNTER — Ambulatory Visit: Payer: BC Managed Care – PPO | Admitting: Neurology

## 2012-11-15 NOTE — Telephone Encounter (Signed)
Patient left me a vm message stating everything was taken care of and she did not need assistance with anything.

## 2012-11-15 NOTE — Telephone Encounter (Signed)
Left a message to return my call.

## 2012-11-20 ENCOUNTER — Ambulatory Visit (INDEPENDENT_AMBULATORY_CARE_PROVIDER_SITE_OTHER): Payer: BC Managed Care – PPO | Admitting: Neurology

## 2012-11-20 ENCOUNTER — Encounter: Payer: Self-pay | Admitting: Neurology

## 2012-11-20 VITALS — BP 106/68 | HR 64 | Temp 98.1°F | Resp 12 | Ht 63.0 in | Wt 117.0 lb

## 2012-11-20 DIAGNOSIS — G5620 Lesion of ulnar nerve, unspecified upper limb: Secondary | ICD-10-CM

## 2012-11-20 NOTE — Progress Notes (Signed)
Brandi Wagner returns for followup of paresthesias in an ulnar distribution bilaterally.  She has tried to wean on her elbows little bit less and she has seen a decrease in the paresthesias in the fourth and fifth fingers of both hands.  She still has some pain from the elbow area radiating in a central direction halfway up the upper arms, more so on the left.  She still rarely has an episode of numbness in the left foot this does not seem to be related in time to when she has the paresthesias in the ulnar distribution.  She mentions that she had a small patch over the sacrum with some hair on her when she was a child it was checked out by a child neurologist.  Her MRI of the brain is completely unremarkable with no evidence of MS. She's had no further episodes of numbness of her lip.  She is sleeping a little bit better overall and she did take Benadryl on a couple of nights which did help.  The cervical MRI shows a very small C4-5 disc without any neural impingement.  This is probably in the central direction or may be slightly eccentric to the left.  She just has the incidental finding of a very small syrinx in the lower cervical and upper thoracic cord.  There is no joint malformation present.  This most likely represents an incidental finding.  If we do need to repeat the cervical MRI in the future, we will do so with contrast.  However, at this point, the patient is happy that the tingling is decreased despite decreasing the amount of time she leans on her elbows, and that there is no sign of aneurysm or MS.  She has developed no new symptoms. She does have occasional indigestion when she eats fatty foods on account of having had her gallbladder removed.  Still occasional light sleeping at night.  Also some occasional pain in the neck radiating into the left shoulder area.  Remainder of review of systems is unremarkable.  Past Medical History  Diagnosis Date  . GERD (gastroesophageal reflux disease)   .  Headache   . Ventricular septal defect     sees Dr. Dietrich Pates     Current Outpatient Prescriptions on File Prior to Visit  Medication Sig Dispense Refill  . norgestimate-ethinyl estradiol (ORTHO-CYCLEN,SPRINTEC,PREVIFEM) 0.25-35 MG-MCG tablet Take 1 tablet by mouth daily.       No current facility-administered medications on file prior to visit.   Promethazine hcl allergy History   Social History  . Marital Status: Single    Spouse Name: N/A    Number of Children: N/A  . Years of Education: N/A   Occupational History  . Not on file.   Social History Main Topics  . Smoking status: Never Smoker   . Smokeless tobacco: Never Used  . Alcohol Use: 1.0 oz/week    2 drink(s) per week     Comment: weekends  . Drug Use: Yes    Special: Marijuana  . Sexually Active: Not on file   Other Topics Concern  . Not on file   Social History Narrative  . No narrative on file    Family History  Problem Relation Age of Onset  . Hypertension      BP 106/68  Pulse 64  Temp(Src) 98.1 F (36.7 C)  Resp 12  Ht 5\' 3"  (1.6 m)  Wt 117 lb (53.071 kg)  BMI 20.73 kg/m2   Alert and oriented x 3.  Memory function appears to be intact.  Concentration and attention are normal for educational level and background.  Speech is fluent and without significant word finding difficulty.  Is aware of current events.  No carotid bruits detected.  Cranial nerve II through XII are within normal limits.  This includes normal optic discs and acuity, EOMI, PERLA, facial movement and sensation intact, hearing grossly intact, gag intact,Uvula raises symmetrically and tongue protrudes evenly. Motor strength is 5 over 5 throughout all limbs.  No atrophy, abnormal tone or tremors. Reflexes are 2+ and symmetric in the upper and lower extremities Sensory exam is intact. Coordination is intact for fine movements and rapid alternating movements in all limbs Gait and station are normal.  Pressure over the ulnar groove  at the elbow does reproduce some of the symptoms, although she may be slightly less tender than before.  Impression: 1. Ulnar distribution numbness bilaterally with pain in the elbows.  This has decreased somewhat by decreasing amount of time she spends leaning on her elbows.  This most likely represents ulnar neuropathy at or near the elbows.  Brain MRI is negative for MS.  The small syrinx in the cervical cord is likely an incidental finding.  2. She does have a small seat 5-6 disc.  We discussed some conservative measures such as stretching, physical therapy acupuncture which could help her cervical alignment to prevent this become becoming a problem in the future.  I do not think at this particular disc is causing any neural impingement or is  Significantly contribute to the paresthesias that she is currently experiencing.  Plan: She has not yet gotten the elbow basis I recommended for her to wear at night to change her position of her elbows at night which can be helpful for ulnar neuropathy. She will do this and continue to avoid leaning on her elbows and see if the symptoms continue to improve.  I described the nerve conduction study test to her and she did not want to do that test at this time. She should contact me if her symptoms worsen.

## 2012-11-20 NOTE — Patient Instructions (Addendum)
Follow up as needed

## 2012-11-29 ENCOUNTER — Telehealth: Payer: Self-pay | Admitting: Family Medicine

## 2012-11-29 DIAGNOSIS — G5620 Lesion of ulnar nerve, unspecified upper limb: Secondary | ICD-10-CM

## 2012-11-29 NOTE — Telephone Encounter (Signed)
Pt's mother would like for you to refer them to another MD, w/ [facility]:  Dr Naaman Plummer. Family not happy w/ Dr Smiley Houseman. They do not want to go back to him. Not taking care of their needs. Pls advise. Pt would like to do something quickly, pt in a lot of pain.

## 2012-11-29 NOTE — Telephone Encounter (Signed)
Tell her I did refer her to Dr. Alvester Morin

## 2012-11-30 NOTE — Telephone Encounter (Signed)
I left voice message with below information. 

## 2012-12-14 ENCOUNTER — Ambulatory Visit: Payer: BC Managed Care – PPO | Admitting: Internal Medicine

## 2013-04-27 ENCOUNTER — Other Ambulatory Visit: Payer: Self-pay | Admitting: Obstetrics and Gynecology

## 2013-05-31 ENCOUNTER — Other Ambulatory Visit: Payer: Self-pay

## 2013-07-25 ENCOUNTER — Encounter: Payer: Self-pay | Admitting: Family Medicine

## 2013-07-25 ENCOUNTER — Ambulatory Visit (INDEPENDENT_AMBULATORY_CARE_PROVIDER_SITE_OTHER): Payer: BC Managed Care – PPO | Admitting: Family Medicine

## 2013-07-25 VITALS — BP 100/74 | Temp 98.0°F | Wt 119.0 lb

## 2013-07-25 DIAGNOSIS — Q210 Ventricular septal defect: Secondary | ICD-10-CM

## 2013-07-25 DIAGNOSIS — K219 Gastro-esophageal reflux disease without esophagitis: Secondary | ICD-10-CM

## 2013-07-25 DIAGNOSIS — R1013 Epigastric pain: Secondary | ICD-10-CM

## 2013-07-25 NOTE — Progress Notes (Signed)
Chief Complaint  Patient presents with  . Abdominal Pain    nausea, sore and tender to touch, hurts when breathing, dizziness, tunnel vision     HPI:  Brandi Wagner is a 27 yo F pt of Dr. Clent Ridges with a PMH of GERD and cholecystectomy, here for an Acute visit for epigastric discomfort: -abd pain epigastric a few days ago but getting better - does take prilosec occ for her chronic GERD but not taking it recently -when stomach was really hurting felt brief episode of nausea, sweating and felt sick and a little lightheaded and felt like was going to pass out - but felt fine after drank water and sat down and no further episodes of this since -hx of  VSD and sees cardiologist for this - has had presyncope in the past and has seen neuro and cards in the past -had gallbladder out remotely and has occ abd pain about a few times per month for years and wants to see a gastroenterologist -denies: fevers, chills, weight loss, hematochezia, melena, vomiting, constipation, diarrhea, change in bowels, dysuria or vaginal symptoms    ROS: See pertinent positives and negatives per HPI.  Past Medical History  Diagnosis Date  . GERD (gastroesophageal reflux disease)   . Headache(784.0)   . Ventricular septal defect     sees Dr. Dietrich Pates     Past Surgical History  Procedure Laterality Date  . Cholecystectomy      Family History  Problem Relation Age of Onset  . Hypertension      History   Social History  . Marital Status: Single    Spouse Name: N/A    Number of Children: N/A  . Years of Education: N/A   Social History Main Topics  . Smoking status: Never Smoker   . Smokeless tobacco: Never Used  . Alcohol Use: 1.0 oz/week    2 drink(s) per week     Comment: weekends  . Drug Use: Yes    Special: Marijuana  . Sexual Activity: None   Other Topics Concern  . None   Social History Narrative  . None    Current outpatient prescriptions:naproxen sodium (ANAPROX) 220 MG tablet, Take  220 mg by mouth as needed., Disp: , Rfl: ;  norgestimate-ethinyl estradiol (ORTHO-CYCLEN,SPRINTEC,PREVIFEM) 0.25-35 MG-MCG tablet, Take 1 tablet by mouth daily., Disp: , Rfl:   EXAM:  Filed Vitals:   07/25/13 1008  BP: 100/74  Temp: 98 F (36.7 C)    Body mass index is 21.09 kg/(m^2).  GENERAL: vitals reviewed and listed above, alert, oriented, appears well hydrated and in no acute distress  HEENT: atraumatic, conjunttiva clear, no obvious abnormalities on inspection of external nose and ears  NECK: no obvious masses on inspection  LUNGS: clear to auscultation bilaterally, no wheezes, rales or rhonchi, good air movement  CV: HRRR, SEM, no peripheral edema  ABD: soft, BS+, no rebound or guarding, mild diffuse TTP  MS: moves all extremities without noticeable abnormality  PSYCH: pleasant and cooperative, no obvious depression, somewhat anxious   ASSESSMENT AND PLAN:  Discussed the following assessment and plan:  Abdominal pain, epigastric - Plan: Ambulatory referral to Gastroenterology  Ventricular septal defect  GERD (gastroesophageal reflux disease) - Plan: Ambulatory referral to Gastroenterology  -we discussed possible serious and likely etiologies, workup and treatment, treatment risks and return precautions -after this discussion, [name] opted for evaluation with GI for what seems to be chronic intermittent abd pain which is most likely GERD or related to  chole and this episode is resolving. Advised PPI in meantime and offered blood work for h.pylori, lfts, cbc but she will hold off on this and do with GI. -presyncope sounds vagal related to abd pain, but advised she see her cardiologist given hx VSD -follow up advised in 1-2 months with PCP and PRN -of course, we advised [name]  to return or notify a doctor immediately if symptoms worsen or persist or new concerns arise.  .  -Patient advised to return or notify a doctor immediately if symptoms worsen or persist or  new concerns arise.  Patient Instructions  -We placed a referral for you as discussed. It usually takes about 1-2 weeks to process and schedule this referral. If you have not heard from [name] regarding this appointment in 2 weeks please contact our office.  -in the meantime take 20mg  prilosec daily  -call your cardiologist for appointment for evaluation regarding occasional presyncope (feeling dizzy) though this may have been related to the abdominal pain  -follow up with your doctor in 1-2 months or see a doctor immediately if worsening symptoms       [name].

## 2013-07-25 NOTE — Progress Notes (Signed)
Pre visit review using our clinic review tool, if applicable. No additional management support is needed unless otherwise documented below in the visit note. 

## 2013-07-25 NOTE — Patient Instructions (Signed)
-  We placed a referral for you as discussed. It usually takes about 1-2 weeks to process and schedule this referral. If you have not heard from [name] regarding this appointment in 2 weeks please contact our office.  -in the meantime take 20mg  prilosec daily  -call your cardiologist for appointment for evaluation regarding occasional presyncope (feeling dizzy) though this may have been related to the abdominal pain  -follow up with your doctor in 1-2 months or see a doctor immediately if worsening symptoms

## 2013-08-13 ENCOUNTER — Encounter: Payer: Self-pay | Admitting: Gastroenterology

## 2013-09-03 ENCOUNTER — Ambulatory Visit: Payer: BC Managed Care – PPO | Admitting: Gastroenterology

## 2013-09-03 ENCOUNTER — Telehealth: Payer: Self-pay | Admitting: Gastroenterology

## 2013-09-03 NOTE — Telephone Encounter (Signed)
Do you want to charge? 

## 2013-09-03 NOTE — Telephone Encounter (Signed)
OK. Please charge.  

## 2014-02-04 ENCOUNTER — Encounter: Payer: Self-pay | Admitting: Internal Medicine

## 2014-02-04 ENCOUNTER — Ambulatory Visit (INDEPENDENT_AMBULATORY_CARE_PROVIDER_SITE_OTHER): Payer: BC Managed Care – PPO | Admitting: Internal Medicine

## 2014-02-04 VITALS — BP 138/82 | HR 73 | Ht 63.0 in | Wt 117.0 lb

## 2014-02-04 DIAGNOSIS — R059 Cough, unspecified: Secondary | ICD-10-CM

## 2014-02-04 DIAGNOSIS — R42 Dizziness and giddiness: Secondary | ICD-10-CM

## 2014-02-04 DIAGNOSIS — R05 Cough: Secondary | ICD-10-CM

## 2014-02-04 MED ORDER — BENZONATATE 100 MG PO CAPS
100.0000 mg | ORAL_CAPSULE | Freq: Three times a day (TID) | ORAL | Status: DC | PRN
Start: 2014-02-04 — End: 2015-03-10

## 2014-02-04 NOTE — Patient Instructions (Signed)
START TESSALON PERLES AS NEEDED FOR COUGH   Your physician wants you to follow-up in: 1 year ov You will receive a reminder letter in the mail two months in advance. If you don't receive a letter, please call our office to schedule the follow-up appointment.

## 2014-02-04 NOTE — Progress Notes (Addendum)
HPI Patient is a 28 yo with history of perimembranous VSD>  I saw her in 2011.  She had an echo after that showed normal LV/RV size and function, small VSD and no aortic insufficiency. Since seen in 2011 she has gotten married. Last summer had a syncopal spell  She got up from sofa  Went to get a drink of water  Was standing at sink when she got very light headed/dizzy  Sat down.  Felt passed out. Has had dizzy spells since  Usually with changes in position (lying to sitting, sitting to standing)  One time with abdominal pain.  One time with     Allergies  Allergen Reactions  . Promethazine Hcl   . Promethazine Hcl Hives    Current Outpatient Prescriptions  Medication Sig Dispense Refill  . levofloxacin (LEVAQUIN) 500 MG tablet Antibotic - Take 1 tab for ten days      . naproxen sodium (ANAPROX) 220 MG tablet Take 220 mg by mouth as needed.      . norgestimate-ethinyl estradiol (ORTHO-CYCLEN,SPRINTEC,PREVIFEM) 0.25-35 MG-MCG tablet Take 1 tablet by mouth. AS DIRECTED       No current facility-administered medications for this visit.    Past Medical History  Diagnosis Date  . GERD (gastroesophageal reflux disease)   . Headache(784.0)   . Ventricular septal defect     sees Dr. Dietrich PatesPaula Shooter Tangen   . Eczema   . Allergic rhinitis     Past Surgical History  Procedure Laterality Date  . Cholecystectomy      Family History  Problem Relation Age of Onset  . Hypertension      History   Social History  . Marital Status: Single    Spouse Name: N/A    Number of Children: N/A  . Years of Education: N/A   Occupational History  . Not on file.   Social History Main Topics  . Smoking status: Never Smoker   . Smokeless tobacco: Never Used  . Alcohol Use: 1.0 oz/week    2 drink(s) per week     Comment: weekends  . Drug Use: Yes    Special: Marijuana  . Sexual Activity: Not on file   Other Topics Concern  . Not on file   Social History Narrative  . No narrative on file     Review of Systems:  All systems reviewed.  They are negative to the above problem except as previously stated.  Vital Signs: BP 125/82  Pulse 73  Ht 5\' 3"  (1.6 m)  Wt 117 lb (53.071 kg)  BMI 20.73 kg/m2  Physical Exam Patient is in NAD   HEENT:  Normocephalic, atraumatic. EOMI, PERRLA.  Neck: JVP is normal.  No bruits.  Lungs: clear to auscultation. No rales no wheezes.  Heart: Regular rate and rhythm. Normal S1, S2. No S3  Gr II/VI systolic murmur. PMI not displaced.  Abdomen:  Supple, nontender. Normal bowel sounds. No masses. No hepatomegaly.  Extremities:   Good distal pulses throughout. No lower extremity edema.  Musculoskeletal :moving all extremities.  Neuro:   alert and oriented x3.  CN II-XII grossly intact.  EKG  SR 69 bpm  RBBB. Assessment and Plan:  1.  VSD  Exam unchanged  NO murmur to suggest AI  Continue to follow  2.  Orthostasis/syncope.  Orthostatics today:  BP 125/82  P 73  Sitting:  BP 121/74  P63;  Standing:  130/80  P 77   Patinet remained asymptomatic. I still think she has  a tendency to orthostasis based on history of event.s  No do not think related ao arrhythmia Encouraged her to increase fluid intake, get adequate salt.  Will follow.  F/U in 1 year.

## 2014-05-01 ENCOUNTER — Other Ambulatory Visit: Payer: Self-pay | Admitting: Obstetrics and Gynecology

## 2014-05-02 LAB — CYTOLOGY - PAP

## 2014-05-06 ENCOUNTER — Encounter: Payer: Self-pay | Admitting: Family Medicine

## 2014-05-06 ENCOUNTER — Ambulatory Visit (INDEPENDENT_AMBULATORY_CARE_PROVIDER_SITE_OTHER): Payer: BC Managed Care – PPO | Admitting: Family Medicine

## 2014-05-06 VITALS — BP 118/84 | HR 76 | Temp 98.7°F | Ht 63.0 in | Wt 119.0 lb

## 2014-05-06 DIAGNOSIS — J208 Acute bronchitis due to other specified organisms: Secondary | ICD-10-CM

## 2014-05-06 MED ORDER — AZITHROMYCIN 250 MG PO TABS: ORAL_TABLET | ORAL | Status: DC

## 2014-05-06 MED ORDER — HYDROCODONE-HOMATROPINE 5-1.5 MG/5ML PO SYRP: 5.0000 mL | ORAL_SOLUTION | ORAL | Status: DC | PRN

## 2014-05-06 NOTE — Progress Notes (Signed)
Pre visit review using our clinic review tool, if applicable. No additional management support is needed unless otherwise documented below in the visit note. 

## 2014-05-06 NOTE — Progress Notes (Signed)
   Subjective:    Patient ID: Brandi Wagner, female    DOB: 07/12/1986, 28 y.o.   MRN: [id number]  HPI Here for 10 days of stuffy head, PND, chest congestion and a dry cough. Also yesterday her left eye became red and irritated. No DC. She has stopped wearing her contacts for now.    Review of Systems  Constitutional: Negative.   HENT: Positive for congestion and postnasal drip. Negative for sinus pressure.   Eyes: Positive for redness and itching. Negative for photophobia, pain, discharge and visual disturbance.  Respiratory: Positive for cough.        Objective:   Physical Exam  Constitutional: She appears well-developed and well-nourished.  HENT:  Right Ear: External ear normal.  Left Ear: External ear normal.  Nose: Nose normal.  Mouth/Throat: Oropharynx is clear and moist.  Eyes: Pupils are equal, round, and reactive to light.  Right eye is clear. Left conjunctiva is red  Pulmonary/Chest: Effort normal and breath sounds normal.  Lymphadenopathy:    She has no cervical adenopathy.          Assessment & Plan:  Treat the bronchitis with a Zpack. Use Artificial Tears prn. Written out of work today and tomorrow.

## 2014-06-04 ENCOUNTER — Other Ambulatory Visit: Payer: Self-pay | Admitting: Obstetrics and Gynecology

## 2014-12-10 ENCOUNTER — Ambulatory Visit (INDEPENDENT_AMBULATORY_CARE_PROVIDER_SITE_OTHER): Payer: BLUE CROSS/BLUE SHIELD | Admitting: Family Medicine

## 2014-12-10 ENCOUNTER — Encounter: Payer: Self-pay | Admitting: Family Medicine

## 2014-12-10 VITALS — BP 108/72 | HR 82 | Temp 98.8°F | Ht 63.0 in | Wt 124.0 lb

## 2014-12-10 DIAGNOSIS — S20211A Contusion of right front wall of thorax, initial encounter: Secondary | ICD-10-CM

## 2014-12-10 NOTE — Progress Notes (Signed)
Pre visit review using our clinic review tool, if applicable. No additional management support is needed unless otherwise documented below in the visit note. 

## 2014-12-10 NOTE — Progress Notes (Signed)
   Subjective:    Patient ID: Brandi HarnessCarrie E Lorman, female    DOB: 10/12/1985, 29 y.o.   MRN: [id number]  HPI Here for pain in the right anterior lower ribs after an accident on 12-08-14. 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 She landed on her chest on the sand and felt a mild rib pain. She continued playing the rest of the match. That night however the pain became more intense. She took some Vicodin she had at home and slept well. Last night she took Aleve and this helped. Today it is still sore and it hurts to take a deep breath. She is not SOB however.    Review of Systems  Constitutional: Negative.   Respiratory: Negative.   Cardiovascular: Positive for chest pain. Negative for palpitations and leg swelling.       Objective:   Physical Exam  Constitutional: She appears well-developed and well-nourished.  Comfortable sitting still   Cardiovascular: Normal rate, regular rhythm, normal heart sounds and intact distal pulses.   Pulmonary/Chest: Effort normal and breath sounds normal. No respiratory distress. She has no wheezes. She has no rales.  Tender along the lower right anterior and lateral ribs. No ecchymosis seen. No crepitus           Assessment & Plan:  Rib contusion. Use heat and Aleve prn. Recheck prn

## 2014-12-11 ENCOUNTER — Telehealth: Payer: Self-pay | Admitting: Family Medicine

## 2014-12-11 ENCOUNTER — Ambulatory Visit (INDEPENDENT_AMBULATORY_CARE_PROVIDER_SITE_OTHER)
Admission: RE | Admit: 2014-12-11 | Discharge: 2014-12-11 | Disposition: A | Discharge status: Home / Self Care | Payer: BLUE CROSS/BLUE SHIELD | Source: Ambulatory Visit | Attending: Family Medicine | Admitting: Family Medicine

## 2014-12-11 DIAGNOSIS — S20211A Contusion of right front wall of thorax, initial encounter: Secondary | ICD-10-CM | POA: Diagnosis not present

## 2014-12-11 MED ORDER — TRAMADOL HCL 50 MG PO TABS: ORAL_TABLET | ORAL | Status: DC

## 2014-12-11 NOTE — Addendum Note (Signed)
Addended by: Gershon CraneFRY, Saraia Platner A on: 12/11/2014 01:10 PM   Modules accepted: Orders

## 2014-12-11 NOTE — Telephone Encounter (Signed)
Pt mom is calling to request her daughter have chest xray

## 2014-12-11 NOTE — Telephone Encounter (Signed)
I called in script and spoke with pt. 

## 2014-12-11 NOTE — Telephone Encounter (Signed)
I have put in an order for rib X[facility] at [facility] so she can get these anytime (even this afternoon). Call in Tramadol 50 mg to take 1-2 every 6 hours prn pain, #60 with one rf

## 2014-12-11 NOTE — Telephone Encounter (Signed)
Pt said her pain is not any better and is asking if Dr Clent RidgesFry can call her in something    [facility] [address]

## 2015-01-07 ENCOUNTER — Other Ambulatory Visit: Payer: Self-pay | Admitting: Obstetrics and Gynecology

## 2015-01-08 LAB — CYTOLOGY - PAP

## 2015-03-10 ENCOUNTER — Ambulatory Visit (INDEPENDENT_AMBULATORY_CARE_PROVIDER_SITE_OTHER): Payer: BLUE CROSS/BLUE SHIELD | Admitting: Family Medicine

## 2015-03-10 ENCOUNTER — Encounter: Payer: Self-pay | Admitting: Family Medicine

## 2015-03-10 VITALS — BP 124/84 | HR 67 | Temp 98.8°F | Ht 63.0 in | Wt 122.0 lb

## 2015-03-10 DIAGNOSIS — R51 Headache: Secondary | ICD-10-CM

## 2015-03-10 DIAGNOSIS — R519 Headache, unspecified: Secondary | ICD-10-CM

## 2015-03-10 MED ORDER — PREDNISONE 10 MG PO TABS: ORAL_TABLET | ORAL | Status: DC

## 2015-03-10 NOTE — Progress Notes (Signed)
   Subjective:    Patient ID: Brandi Wagner, female    DOB: 11/12/1985, 29 y.o.   MRN: [id number]  HPI Here for intermittent headaches in the left temple area for the past 3 days. She has never felt this before. Tylenol or Advil helps at times but not always. She has had some nausea with them but has not vomited. No vision changes. No neurologic deficits. She is worried due to a family hx of aneurysms, but she has had several normal brain MRI scans, most recently 2 years ago. Often when she has these headaches her scalp gets very sensitive to the touch in the left temple area.    Review of Systems  Constitutional: Negative.   Respiratory: Negative.   Cardiovascular: Negative.   Neurological: Positive for headaches. Negative for dizziness, tremors, seizures, syncope, facial asymmetry, speech difficulty, weakness, light-headedness and numbness.       Objective:   Physical Exam  Constitutional: She is oriented to person, place, and time. She appears well-developed and well-nourished.  Cardiovascular: Normal rate, regular rhythm, normal heart sounds and intact distal pulses.   Pulmonary/Chest: Effort normal and breath sounds normal.  Neurological: She is alert and oriented to person, place, and time. She has normal reflexes. No cranial nerve deficit. She exhibits normal muscle tone. Coordination normal.          Assessment & Plan:  Trigeminal neuralgia, treat with a prednisone taper.

## 2015-03-10 NOTE — Progress Notes (Signed)
Pre visit review using our clinic review tool, if applicable. No additional management support is needed unless otherwise documented below in the visit note. 

## 2015-03-18 ENCOUNTER — Telehealth: Payer: Self-pay | Admitting: Family Medicine

## 2015-03-18 DIAGNOSIS — R51 Headache: Principal | ICD-10-CM

## 2015-03-18 DIAGNOSIS — R519 Headache, unspecified: Secondary | ICD-10-CM

## 2015-03-18 NOTE — Telephone Encounter (Signed)
Tell her I have ordered a CT scan of her head to make sure everything is okay inside

## 2015-03-18 NOTE — Telephone Encounter (Signed)
Pt was to let dr fry know if she was not any better w/ the HA. Mom is calling for pt b/c she has  Gone to work. Mom states pt got better until yesterday. Then pt got pressure all the way across the top of her head and it felt "full". Pt did not sleep well at all, this am had the pressure and her ear popped.  Pt has one day left of the prednisone.   Lots of family history of aneurism.  Pt work number [phone]  Ext [phone]  Pt Cell [phone]

## 2015-03-20 NOTE — Telephone Encounter (Signed)
I spoke with pt  

## 2015-03-21 ENCOUNTER — Telehealth: Payer: Self-pay | Admitting: Family Medicine

## 2015-03-21 ENCOUNTER — Ambulatory Visit (INDEPENDENT_AMBULATORY_CARE_PROVIDER_SITE_OTHER)
Admission: RE | Admit: 2015-03-21 | Discharge: 2015-03-21 | Disposition: A | Discharge status: Home / Self Care | Payer: BLUE CROSS/BLUE SHIELD | Source: Ambulatory Visit | Attending: Family Medicine | Admitting: Family Medicine

## 2015-03-21 DIAGNOSIS — R519 Headache, unspecified: Secondary | ICD-10-CM

## 2015-03-21 DIAGNOSIS — R51 Headache: Secondary | ICD-10-CM | POA: Diagnosis not present

## 2015-03-21 MED ORDER — SUMATRIPTAN SUCCINATE 100 MG PO TABS: ORAL_TABLET | ORAL | Status: DC

## 2015-03-21 NOTE — Telephone Encounter (Signed)
I spoke with pt and went over results. 

## 2015-03-21 NOTE — Telephone Encounter (Signed)
Pt would like ct scan results. Pt had test today

## 2015-03-24 ENCOUNTER — Telehealth: Payer: Self-pay | Admitting: Internal Medicine

## 2015-03-24 NOTE — Telephone Encounter (Signed)
New Message   Pt is calling to make appt with Dr. Tenny Craw  11/14- @ 4:15pm  Pt states after appt that she has been experiencing headaches   and that she wants the Dr.Ross To be aware, pt states that she is   not having any issues currently for smart phrase  Please give her a call

## 2015-03-24 NOTE — Telephone Encounter (Signed)
Calling stating that she has been experiencing HA's off and on over the past few weeks. States over weekend she has pressure in her head, nauseated, dizzy and feels disoriented.  Has not been sleeping well at night.  Was started on a new birth control pill about 3 mo ago.  She has seen Dr.Fry for the headaches who did a CT scan on head on 8/26 which was normal.  She states he gave her a prednisone dose pack and something for migraines but she hasn't started that Rx.  Has not started any new medications.  Advised her to contact Dr. Clent Ridges in follow up. She verbalizes understanding and will call his office.

## 2015-05-09 ENCOUNTER — Telehealth: Payer: Self-pay | Admitting: Family Medicine

## 2015-05-09 NOTE — Telephone Encounter (Signed)
I spoke with pt and she did go to Urgent Care today. She will follow up with Dr. Clent RidgesFry in about 2 weeks, she is going out of town. She is feeling better now, if she has any more of these type symptoms she will go to the ER.

## 2015-05-09 NOTE — Telephone Encounter (Signed)
Patient Name: Brandi HooverCARRIE Wagner  DOB: 05/24/1986    Initial Comment Caller states she is having blurred vision in left eye, dizzy, bp 140/90.   Nurse Assessment  Nurse: Sherilyn CooterHenry, RN, Thurmond ButtsWade Date/Time Lamount Cohen(Eastern Time): 05/09/2015 11:36:07 AM  Confirm and document reason for call. If symptomatic, describe symptoms. ---Caller states that she has blurry vision in her left eye for the past eye. The blurry vision is beginning to clear up. It first began in her right eye then shifted to her left. When it first began, she was seeing spots with the right eye then she was seeing spots with both eyes. Now only the left eye is affected with blurry vision. She denies injury. She is dizzy, but not room spinning. She still has some dizziness and "jittery." BP was 140/90. She has a little bit of a headache with this. She denies eye pain. She denies injury to the eye. She does wear contacts.  Has the patient traveled out of the country within the last 30 days? ---No  Does the patient have any new or worsening symptoms? ---Yes  Will a triage be completed? ---Yes  Related visit to physician within the last 2 weeks? ---No  Does the PT have any chronic conditions? (i.e. diabetes, asthma, etc.) ---Yes  List chronic conditions. ---Hole in her heart  Did the patient indicate they were pregnant? ---No     Guidelines    Guideline Title Affirmed Question Affirmed Notes  Vision Loss or Change [1] Blurred vision or visual changes AND [2] present now AND [3] sudden onset or new (e.g., minutes, hours, days) (Exception: previously diagnosed migraine headaches with same symptoms)    Final Disposition User   Go to ED Now (or PCP triage) Sherilyn CooterHenry, RN, Thurmond ButtsWade    Comments  I had just checked both [facility] and [facility] prior to this call and no appointments were available. She decided to go to an UC to be seen.   Referrals  GO TO FACILITY OTHER - SPECIFY   Disagree/Comply: Comply

## 2015-05-09 NOTE — Telephone Encounter (Signed)
Please call to confirm patient's status

## 2015-06-09 ENCOUNTER — Encounter: Payer: Self-pay | Admitting: Internal Medicine

## 2015-06-09 ENCOUNTER — Ambulatory Visit (INDEPENDENT_AMBULATORY_CARE_PROVIDER_SITE_OTHER): Payer: BLUE CROSS/BLUE SHIELD | Admitting: Internal Medicine

## 2015-06-09 VITALS — BP 118/80 | HR 72 | Ht 63.0 in | Wt 126.4 lb

## 2015-06-09 DIAGNOSIS — Q210 Ventricular septal defect: Secondary | ICD-10-CM

## 2015-06-09 MED ORDER — PROPRANOLOL HCL 10 MG PO TABS: 10.0000 mg | ORAL_TABLET | Freq: Two times a day (BID) | ORAL | Status: DC

## 2015-06-09 NOTE — Progress Notes (Signed)
Cardiology Office Note   Date:  06/09/2015   ID:  Brandi Wagner, DOB 10/18/1985, MRN [id number]  PCP:  Nelwyn SalisburyFRY,STEPHEN A, MD  Cardiologist:   Dietrich PatesPaula Vanity Larsson, MD   No chief complaint on file.  F/U of VSD   History of Present Illness: Brandi Wagner is a 29 y.o. female with a history of  perimembranous VSD> I saw her in 422015. She had an echo after that showed normal LV/RV size and function, small VSD and no aortic insufficiency She had an episode of syncope in past with standing  SInce I saw her she has had spells where she saw spots.  Felt faint  BP 135/90  Called Dr Clent RidgesFry  Urgent care 140/  Did not go to ER . Does get SOB with activity    Current Outpatient Prescriptions  Medication Sig Dispense Refill  . norgestimate-ethinyl estradiol (ORTHO-CYCLEN,SPRINTEC,PREVIFEM) 0.25-35 MG-MCG tablet Take 1 tablet by mouth. AS DIRECTED    . valACYclovir (VALTREX) 1000 MG tablet take 1 tablet by mouth twice a day for 7 days  0   No current facility-administered medications for this visit.    Allergies:   Promethazine hcl   Past Medical History  Diagnosis Date  . GERD (gastroesophageal reflux disease)   . Headache(784.0)   . Ventricular septal defect     sees Dr. Dietrich PatesPaula Harman Langhans   . Eczema   . Allergic rhinitis     Past Surgical History  Procedure Laterality Date  . Cholecystectomy       Social History:  The patient  reports that she has never smoked. She has never used smokeless tobacco. She reports that she drinks about 1.2 oz of alcohol per week. She reports that she does not use illicit drugs.   Family History:  The patient's family history includes Anuerysm in her mother; Hypertension in her mother and another family member.    ROS:  Please see the history of present illness. All other systems are reviewed and  Negative to the above problem except as noted.    PHYSICAL EXAM: VS:  BP 118/80 mmHg  Pulse 72  Ht 5\' 3"  (1.6 m)  Wt 57.335 kg (126 lb 6.4 oz)  BMI 22.40 kg/m2    GEN: Well nourished, well developed, in no acute distress HEENT: normal Neck: no JVD, carotid bruits, or masses Cardiac: RRR; Gr II/VI systolic murmur Lsternal border  No rubs, or gallops,no edema  Respiratory:  clear to auscultation bilaterally, normal work of breathing GI: soft, nontender, nondistended, + BS  No hepatomegaly  MS: no deformity Moving all extremities   Skin: warm and dry, no rash Neuro:  Strength and sensation are intact Psych: euthymic mood, full affect   EKG:  EKG is ordered today.  SR 72 bpm  RBBB     Lipid Panel No results found for: CHOL, TRIG, HDL, CHOLHDL, VLDL, LDLCALC, LDLDIRECT    Wt Readings from Last 3 Encounters:  06/09/15 57.335 kg (126 lb 6.4 oz)  03/10/15 55.339 kg (122 lb)  12/10/14 56.246 kg (124 lb)      ASSESSMENT AND PLAN   1.  Dizziness  Pt's BP is high on exam with diastolics in 90s  HR does increase with standing 20 points  I would recomm low dose INderal 10 bid to regimen  F/U in 1 month  2.  Dyspnea  Will set up for echo  3.  VSD  Echo.   Signed, Dietrich PatesPaula Almas Rake, MD  06/09/2015 4:27 PM  [facility] [address], [place], [place]  [address] Phone: [phone]; Fax: [phone]

## 2015-06-09 NOTE — Patient Instructions (Signed)
Your physician has recommended you make the following change in your medication:  1.) inderal 10 mg twice a day  Your physician has requested that you have an echocardiogram. Echocardiography is a painless test that uses sound waves to create images of your heart. It provides your doctor with information about the size and shape of your heart and how well your heart's chambers and valves are working. This procedure takes approximately one hour. There are no restrictions for this procedure.  Your physician recommends that you schedule a follow-up appointment in: 4-6 weeks with Dr. Tenny Crawoss.  July 10, 2015--9:15 am

## 2015-06-13 ENCOUNTER — Ambulatory Visit (INDEPENDENT_AMBULATORY_CARE_PROVIDER_SITE_OTHER): Payer: BLUE CROSS/BLUE SHIELD | Admitting: Family Medicine

## 2015-06-13 ENCOUNTER — Encounter: Payer: Self-pay | Admitting: Family Medicine

## 2015-06-13 VITALS — BP 120/92 | HR 69 | Temp 99.2°F | Ht 63.0 in | Wt 126.0 lb

## 2015-06-13 DIAGNOSIS — B029 Zoster without complications: Secondary | ICD-10-CM | POA: Diagnosis not present

## 2015-06-13 NOTE — Progress Notes (Signed)
   Subjective:    Patient ID: Brandi Wagner, female    DOB: 05/01/1986, 29 y.o.   MRN: [id number]  HPI Here for one week of a patch of red blisters on the left buttock. These itch but are not painful. They seem to be drying up already. She had a supply of Valtrex at home that her GYN had given her for genital herpes, and she has been taking 1000 mg of this BID for 7 days.    Review of Systems  Constitutional: Negative.   Skin: Positive for rash.       Objective:   Physical Exam  Constitutional: She appears well-developed and well-nourished.  Skin:  The left buttock has a patch of red vesicles           Assessment & Plan:  Shingles, which has already been partially treated. I advised her to take the Valtrex TID for 3 more days and then stop. Recheck prn

## 2015-06-13 NOTE — Progress Notes (Signed)
Pre visit review using our clinic review tool, if applicable. No additional management support is needed unless otherwise documented below in the visit note. 

## 2015-06-25 ENCOUNTER — Ambulatory Visit (HOSPITAL_COMMUNITY): Payer: BLUE CROSS/BLUE SHIELD | Attending: Cardiovascular Disease

## 2015-06-25 ENCOUNTER — Other Ambulatory Visit: Payer: Self-pay

## 2015-06-25 DIAGNOSIS — I517 Cardiomegaly: Secondary | ICD-10-CM | POA: Diagnosis not present

## 2015-06-25 DIAGNOSIS — Q210 Ventricular septal defect: Secondary | ICD-10-CM | POA: Insufficient documentation

## 2015-07-10 ENCOUNTER — Encounter: Payer: Self-pay | Admitting: Internal Medicine

## 2015-07-10 ENCOUNTER — Ambulatory Visit (INDEPENDENT_AMBULATORY_CARE_PROVIDER_SITE_OTHER): Payer: BLUE CROSS/BLUE SHIELD | Admitting: Internal Medicine

## 2015-07-10 VITALS — BP 114/78 | HR 75 | Ht 63.0 in | Wt 124.8 lb

## 2015-07-10 DIAGNOSIS — R002 Palpitations: Secondary | ICD-10-CM | POA: Diagnosis not present

## 2015-07-10 DIAGNOSIS — Q210 Ventricular septal defect: Secondary | ICD-10-CM | POA: Diagnosis not present

## 2015-07-10 NOTE — Progress Notes (Signed)
Cardiology Office Note   Date:  07/10/2015   ID:  Brandi Wagner, DOB 06/10/1986, MRN [id number]  PCP:  Nelwyn SalisburyFRY,STEPHEN A, MD  Cardiologist:   Dietrich PatesPaula Senetra Dillin, MD   No chief complaint on file.     History of Present Illness: Brandi Wagner is a 29 y.o. female with a history of VSD (small), I saw her in November   When I saw her she had some dizziness, Seeing spots  BP was elevated at primary   HR was up here  I recomm inderal 10mg  bid   Since seen she says she may feel a little better  Some blurriness L eye started yesterda       Current Outpatient Prescriptions  Medication Sig Dispense Refill  . norgestimate-ethinyl estradiol (ORTHO-CYCLEN,SPRINTEC,PREVIFEM) 0.25-35 MG-MCG tablet Take 1 tablet by mouth. AS DIRECTED    . propranolol (INDERAL) 10 MG tablet Take 1 tablet (10 mg total) by mouth 2 (two) times daily. 60 tablet 6  . valACYclovir (VALTREX) 1000 MG tablet take 1 tablet by mouth twice a day for 7 days  0   No current facility-administered medications for this visit.    Allergies:   Promethazine and Promethazine hcl   Past Medical History  Diagnosis Date  . GERD (gastroesophageal reflux disease)   . Headache(784.0)   . Ventricular septal defect     sees Dr. Dietrich PatesPaula Drenda Sobecki   . Eczema   . Allergic rhinitis     Past Surgical History  Procedure Laterality Date  . Cholecystectomy       Social History:  The patient  reports that she has never smoked. She has never used smokeless tobacco. She reports that she drinks about 1.2 oz of alcohol per week. She reports that she does not use illicit drugs.   Family History:  The patient's family history includes Anuerysm in her mother; Hypertension in her mother.    ROS:  Please see the history of present illness. All other systems are reviewed and  Negative to the above problem except as noted.    PHYSICAL EXAM: VS:  BP 114/78 mmHg  Pulse 75  Ht 5\' 3"  (1.6 m)  Wt 56.609 kg (124 lb 12.8 oz)  BMI 22.11 kg/m2  SpO2 99%    GEN: Well nourished, well developed, in no acute distress HEENT: normal Neck: no JVD, carotid bruits, or masses Cardiac: RRR Gr III/VI systolic murmjr LSB   rubs, or gallops,no edema  Respiratory:  clear to auscultation bilaterally, normal work of breathing GI: soft, nontender, nondistended, + BS  No hepatomegaly  MS: no deformity Moving all extremities   Skin: warm and dry, no rash Neuro:  Strength and sensation are intact Psych: euthymic mood, full affect   EKG:  EKG is not ordered today.   Lipid Panel No results found for: CHOL, TRIG, HDL, CHOLHDL, VLDL, LDLCALC, LDLDIRECT    Wt Readings from Last 3 Encounters:  07/10/15 56.609 kg (124 lb 12.8 oz)  06/13/15 57.153 kg (126 lb)  06/09/15 57.335 kg (126 lb 6.4 oz)      ASSESSMENT AND PLAN:  1.  VSD  Echo shows small VSD  No AI  2.  Palpitations / spots  Doing better on low dose inderal Continue  BP and HR stable with orthostatic check    Blurred vision L eye that she has  now I do not think is related to BP    Signed, Dietrich PatesPaula Prapti Grussing, MD  07/10/2015 9:46 AM    [facility] [place], [place], [place]  [address] Phone: [phone]; Fax: [phone]

## 2015-07-10 NOTE — Patient Instructions (Signed)
Your physician recommends that you continue on your current medications as directed. Please refer to the Current Medication list given to you today. Your physician wants you to follow-up in: 1 year with Dr. Ross.  You will receive a reminder letter in the mail two months in advance. If you don't receive a letter, please call our office to schedule the follow-up appointment.  

## 2015-07-24 ENCOUNTER — Other Ambulatory Visit: Payer: Self-pay | Admitting: Obstetrics and Gynecology

## 2015-07-25 LAB — CYTOLOGY - PAP

## 2015-12-10 DIAGNOSIS — L700 Acne vulgaris: Secondary | ICD-10-CM | POA: Diagnosis not present

## 2016-02-05 ENCOUNTER — Ambulatory Visit (INDEPENDENT_AMBULATORY_CARE_PROVIDER_SITE_OTHER): Payer: BLUE CROSS/BLUE SHIELD | Admitting: Internal Medicine

## 2016-02-05 ENCOUNTER — Encounter: Payer: Self-pay | Admitting: Internal Medicine

## 2016-02-05 VITALS — BP 132/80 | Temp 98.1°F | Wt 124.4 lb

## 2016-02-05 DIAGNOSIS — M542 Cervicalgia: Secondary | ICD-10-CM | POA: Diagnosis not present

## 2016-02-05 DIAGNOSIS — R51 Headache: Secondary | ICD-10-CM | POA: Diagnosis not present

## 2016-02-05 DIAGNOSIS — R519 Headache, unspecified: Secondary | ICD-10-CM

## 2016-02-05 MED ORDER — CYCLOBENZAPRINE HCL 5 MG PO TABS: 5.0000 mg | ORAL_TABLET | Freq: Every day | ORAL | Status: DC

## 2016-02-05 MED ORDER — NAPROXEN SODIUM 550 MG PO TABS: 550.0000 mg | ORAL_TABLET | Freq: Two times a day (BID) | ORAL | Status: DC

## 2016-02-05 NOTE — Patient Instructions (Addendum)
This does act like a migraine variant although because this is new for you needs follow-up. Because of the neck tightness that is probably mechanical musculoskeletal try mild muscle relaxant at night for the next 3-4 nights. He can make you bit drowsy sig don't take it during the day. Anti-inflammatories can be helpful for migraine type headaches. Take naproxen as directed until the pain goes away and as needed. Calendar year headaches of all sorts. Follow-up with Dr. Clent Ridges in about a month. Or earlier if headaches are recurrent. If not controlled can consider seeing her neurologist again however I don't see any alarming findings today on exam.    Recurrent Migraine Headache A migraine headache is an intense, throbbing pain on one or both sides of your head. Recurrent migraines keep coming back. A migraine can last for 30 minutes to several hours. CAUSES  The exact cause of a migraine headache is not always known. However, a migraine may be caused when nerves in the brain become irritated and release chemicals that cause inflammation. This causes pain. Certain things may also trigger migraines, such as:   Alcohol.  Smoking.  Stress.  Menstruation.  Aged cheeses.  Foods or drinks that contain nitrates, glutamate, aspartame, or tyramine.  Lack of sleep.  Chocolate.  Caffeine.  Hunger.  Physical exertion.  Fatigue.  Medicines used to treat chest pain (nitroglycerine), birth control pills, estrogen, and some blood pressure medicines. SYMPTOMS   Pain on one or both sides of your head.  Pulsating or throbbing pain.  Severe pain that prevents daily activities.  Pain that is aggravated by any physical activity.  Nausea, vomiting, or both.  Dizziness.  Pain with exposure to bright lights, loud noises, or activity.  General sensitivity to bright lights, loud noises, or smells. Before you get a migraine, you may get warning signs that a migraine is coming (aura). An  aura may include:  Seeing flashing lights.  Seeing bright spots, halos, or zigzag lines.  Having tunnel vision or blurred vision.  Having feelings of numbness or tingling.  Having trouble talking.  Having muscle weakness. DIAGNOSIS  A recurrent migraine headache is often diagnosed based on:  Symptoms.  Physical examination.  A CT scan or MRI of your head. These imaging tests cannot diagnose migraines but can help rule out other causes of headaches.  TREATMENT  Medicines may be given for pain and nausea. Medicines can also be given to help prevent recurrent migraines. HOME CARE INSTRUCTIONS  Only take over-the-counter or prescription medicines for pain or discomfort as directed by your health care provider. The use of long-term narcotics is not recommended.  Lie down in a dark, quiet room when you have a migraine.  Keep a journal to find out what may trigger your migraine headaches. For example, write down:  What you eat and drink.  How much sleep you get.  Any change to your diet or medicines.  Limit alcohol consumption.  Quit smoking if you smoke.  Get 7-9 hours of sleep, or as recommended by your health care provider.  Limit stress.  Keep lights dim if bright lights bother you and make your migraines worse. SEEK MEDICAL CARE IF:   You do not get relief from the medicines given to you.  You have a recurrence of pain.  You have a fever. SEEK IMMEDIATE MEDICAL CARE IF:  Your migraine becomes severe.  You have a stiff neck.  You have loss of vision.  You have muscular weakness or loss of  muscle control.  You start losing your balance or have trouble walking.  You feel faint or pass out.  You have severe symptoms that are different from your first symptoms. MAKE SURE YOU:   Understand these instructions.  Will watch your condition.  Will get help right away if you are not doing well or get worse.   This information is not intended to replace  advice given to you by your health care provider. Make sure you discuss any questions you have with your health care provider.   Document Released: 04/06/2001 Document Revised: 08/02/2014 Document Reviewed: 03/19/2013 Elsevier Interactive Patient Education [employer]2016 [employer]

## 2016-02-05 NOTE — Progress Notes (Signed)
Pre visit review using our clinic review tool, if applicable. No additional management support is needed unless otherwise documented below in the visit note.  Chief Complaint  Patient presents with  . Headache  . Blurred Vision  . Nausea    Has tried Imitrex in the past but makes her feel "crazy."  . Neck Pain    HPI: Brandi Wagner 30 y.o.   Acute visit pcp NA  Co of" migraine " She was rx for  Poss trigeminal neuralgia ha type in the fall with prednisone  She has had some headaches in the past but not like this this week she had some mild headaches in the morning a couple days and then last night headache woke her pressure behind her eyes and top of her head that she described as throbbing with nausea no vomiting. No visual disturbances but earlier had had some blurry type vision. In the past she was evaluated for some tingling in her hands and feet and headaches and had an MRI and no aneurysm. There is a family history of aneurysms and migraines. She has had some neck pain noticed on the right when she was driving to [employer] has a inactive job and does get muscle spasm tension on the right side. She took Tylenol for above no anti-inflammatories. She has a remote history of having taking Imitrex but made her feel bad. She has a small VSD trying to close followed by Dr. Tenny Craw. Remote history of flashing floaters that lasted a few hours that might of been ophthalmic migraine. ROS: See pertinent positives and negatives per HPI. No focal weakness problems or speech problems. Caffeine 1 or none a day no significant alcohol on a regular basis no head trauma does get regular sleep. She just stopped OCPs that she was on for acne. Headache today's 2 out of 10 when it was that it was 100 out of 10. Past Medical History  Diagnosis Date  . GERD (gastroesophageal reflux disease)   . Headache(784.0)   . Ventricular septal defect     sees Dr. Dietrich Pates   . Eczema   . Allergic rhinitis      Family History  Problem Relation Age of Onset  . Hypertension    . Hypertension Mother   . Anuerysm Mother     Brain    Social History   Social History  . Marital Status: Single    Spouse Name: N/A  . Number of Children: N/A  . Years of Education: N/A   Social History Main Topics  . Smoking status: Never Smoker   . Smokeless tobacco: Never Used  . Alcohol Use: 1.2 oz/week    2 Standard drinks or equivalent per week     Comment: weekends  . Drug Use: No  . Sexual Activity: Not on file   Other Topics Concern  . Not on file   Social History Narrative    Outpatient Prescriptions Prior to Visit  Medication Sig Dispense Refill  . norgestimate-ethinyl estradiol (ORTHO-CYCLEN,SPRINTEC,PREVIFEM) 0.25-35 MG-MCG tablet Take 1 tablet by mouth. Reported on 02/05/2016    . propranolol (INDERAL) 10 MG tablet Take 1 tablet (10 mg total) by mouth 2 (two) times daily. (Patient not taking: Reported on 02/05/2016) 60 tablet 6  . valACYclovir (VALTREX) 1000 MG tablet Reported on 02/05/2016  0   No facility-administered medications prior to visit.     EXAM:  BP 132/80 mmHg  Temp(Src) 98.1 F (36.7 C) (Oral)  Wt 124 lb 6.4 oz (  56.427 kg)  Body mass index is 22.04 kg/(m^2).  GENERAL: vitals reviewed and listed above, alert, oriented, appears well hydrated and in no acute distress HEENT: atraumatic, conjunctiva  clear, no obvious abnormalities on inspection of external nose and ears TMs are clear eyes PERRLA EOMs are full. OP : no lesion edema or exudate tongue is midline. NECK: no obvious masses on inspection palpation  Supple  LUNGS: clear to auscultation bilaterally, no wheezes, rales or rhonchi, good air movement CV: HRRR, no clubbing cyanosis or  peripheral edema nl cap refill  MS: moves all extremities without noticeable focal  Abnormality Neurologic is grossly normal DTRs are present no obvious motor weakness face is symmetrical. PSYCH: pleasant and cooperative, no obvious  depression or anxiety  ASSESSMENT AND PLAN:  Discussed the following assessment and plan:  Acute nonintractable headache, unspecified headache type  Neck pain on right side Suspect this is a migraine headache pattern an episode. However she has not had a nocturnal headache in the past. She has had some right neck pain that is probably mechanical related to work etc. we'll try adding short-term low-dose Flexeril at night with caution. Take anti-inflammatory for her headache but not daily to avoid rebound. Track headaches and symptoms and follow-up with Dr. Clent Ridges in one month. If persistent progressive consider getting back with her neurologist. No alarm symptoms seen on exam today. -Patient advised to return or notify health care team  if symptoms worsen ,persist or new concerns arise.  Patient Instructions   This does act like a migraine variant although because this is new for you needs follow-up. Because of the neck tightness that is probably mechanical musculoskeletal try mild muscle relaxant at night for the next 3-4 nights. He can make you bit drowsy sig don't take it during the day. Anti-inflammatories can be helpful for migraine type headaches. Take naproxen as directed until the pain goes away and as needed. Calendar year headaches of all sorts. Follow-up with Dr. Clent Ridges in about a month. Or earlier if headaches are recurrent. If not controlled can consider seeing her neurologist again however I don't see any alarming findings today on exam.    Recurrent Migraine Headache A migraine headache is an intense, throbbing pain on one or both sides of your head. Recurrent migraines keep coming back. A migraine can last for 30 minutes to several hours. CAUSES  The exact cause of a migraine headache is not always known. However, a migraine may be caused when nerves in the brain become irritated and release chemicals that cause inflammation. This causes pain. Certain things may also trigger  migraines, such as:   Alcohol.  Smoking.  Stress.  Menstruation.  Aged cheeses.  Foods or drinks that contain nitrates, glutamate, aspartame, or tyramine.  Lack of sleep.  Chocolate.  Caffeine.  Hunger.  Physical exertion.  Fatigue.  Medicines used to treat chest pain (nitroglycerine), birth control pills, estrogen, and some blood pressure medicines. SYMPTOMS   Pain on one or both sides of your head.  Pulsating or throbbing pain.  Severe pain that prevents daily activities.  Pain that is aggravated by any physical activity.  Nausea, vomiting, or both.  Dizziness.  Pain with exposure to bright lights, loud noises, or activity.  General sensitivity to bright lights, loud noises, or smells. Before you get a migraine, you may get warning signs that a migraine is coming (aura). An aura may include:  Seeing flashing lights.  Seeing bright spots, halos, or zigzag lines.  Having tunnel  vision or blurred vision.  Having feelings of numbness or tingling.  Having trouble talking.  Having muscle weakness. DIAGNOSIS  A recurrent migraine headache is often diagnosed based on:  Symptoms.  Physical examination.  A CT scan or MRI of your head. These imaging tests cannot diagnose migraines but can help rule out other causes of headaches.  TREATMENT  Medicines may be given for pain and nausea. Medicines can also be given to help prevent recurrent migraines. HOME CARE INSTRUCTIONS  Only take over-the-counter or prescription medicines for pain or discomfort as directed by your health care provider. The use of long-term narcotics is not recommended.  Lie down in a dark, quiet room when you have a migraine.  Keep a journal to find out what may trigger your migraine headaches. For example, write down:  What you eat and drink.  How much sleep you get.  Any change to your diet or medicines.  Limit alcohol consumption.  Quit smoking if you smoke.  Get 7-9  hours of sleep, or as recommended by your health care provider.  Limit stress.  Keep lights dim if bright lights bother you and make your migraines worse. SEEK MEDICAL CARE IF:   You do not get relief from the medicines given to you.  You have a recurrence of pain.  You have a fever. SEEK IMMEDIATE MEDICAL CARE IF:  Your migraine becomes severe.  You have a stiff neck.  You have loss of vision.  You have muscular weakness or loss of muscle control.  You start losing your balance or have trouble walking.  You feel faint or pass out.  You have severe symptoms that are different from your first symptoms. MAKE SURE YOU:   Understand these instructions.  Will watch your condition.  Will get help right away if you are not doing well or get worse.   This information is not intended to replace advice given to you by your health care provider. Make sure you discuss any questions you have with your health care provider.   Document Released: 04/06/2001 Document Revised: 08/02/2014 Document Reviewed: 03/19/2013 Elsevier Interactive Patient Education 2016 [employer]      [place] Augustine[place]anda K. Anuradha Chabot M.D.

## 2016-02-25 IMAGING — CT CT HEAD W/O CM
1 series · 16 of 30 positions shown, 20 images · non-contrast
Comparison: 11/10/12

CLINICAL DATA: Left temporal headache

EXAM:
CT HEAD WITHOUT CONTRAST
TECHNIQUE: Contiguous axial images were obtained from the base of the skull
through the vertex without intravenous contrast.

[Series 2: head_seq -c 4.5 h37s st · axial · 0.43mm/px · z∈[-114,+12]mm · 16 of 32 slices shown, 20 images]
[im 2/32  brain]
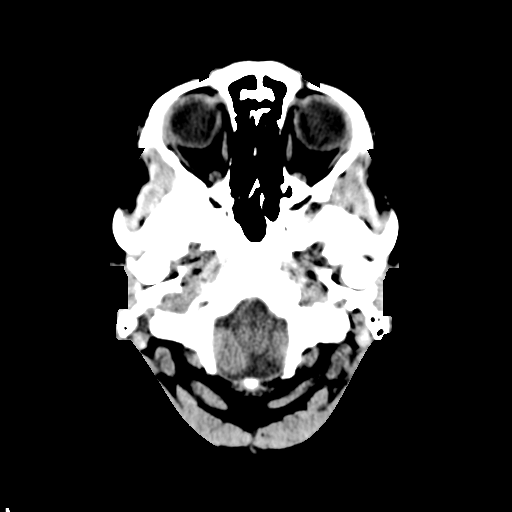
[im 2/32  bone]
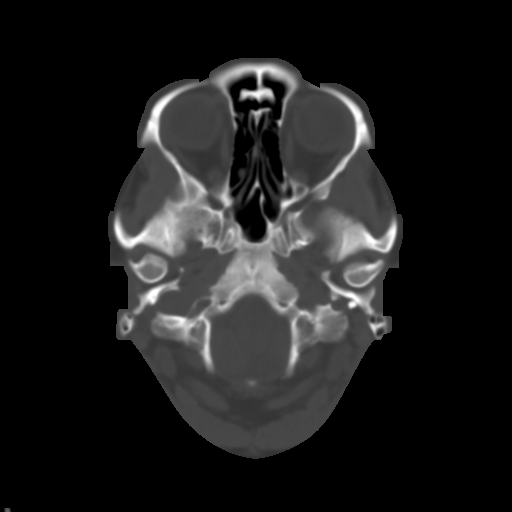
[im 4/32  brain]
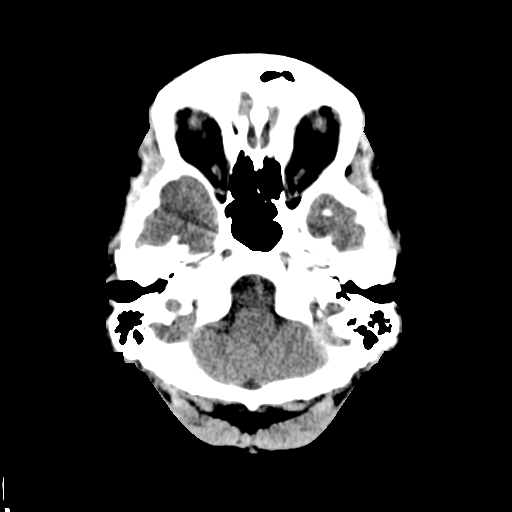
[im 6/32  brain]
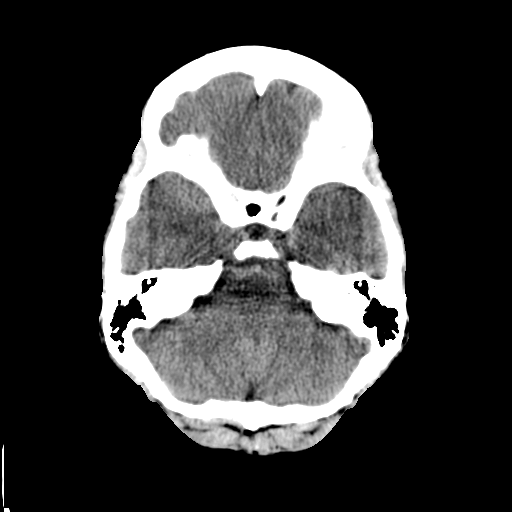
[im 8/32  brain]
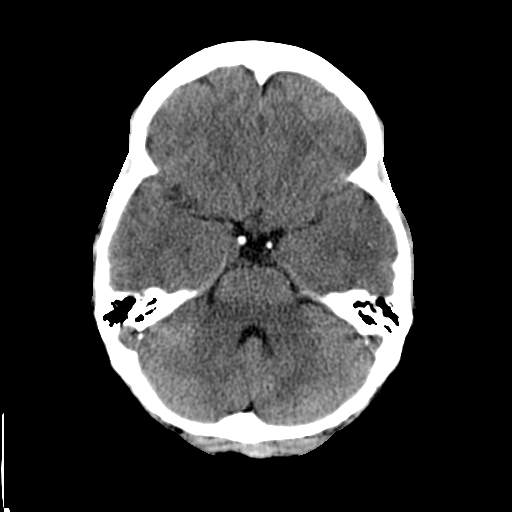
[im 9/32  brain]
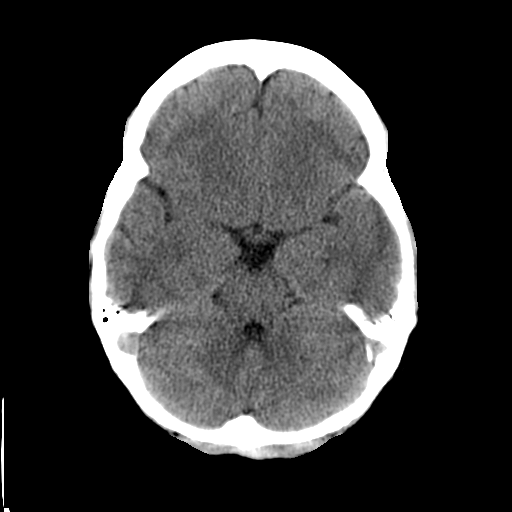
[im 9/32  bone]
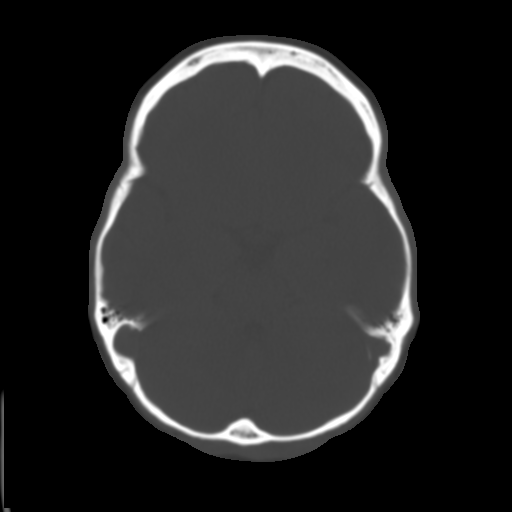
[im 11/32  brain]
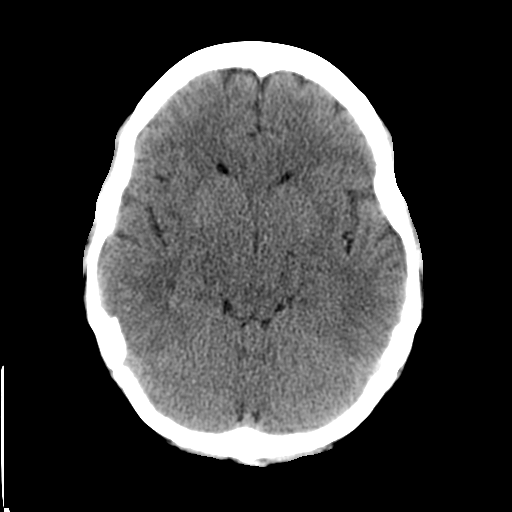
[im 13/32  brain]
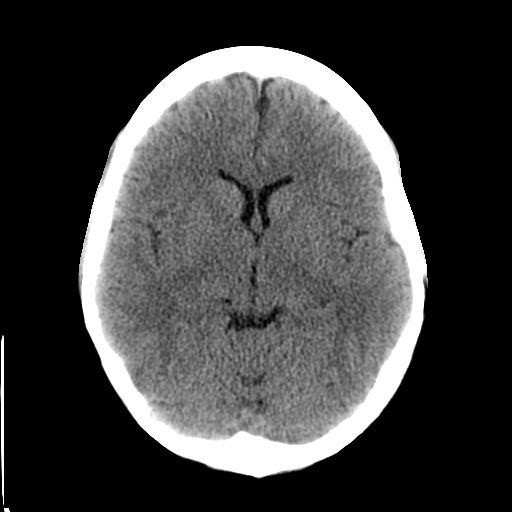
[im 15/32  brain]
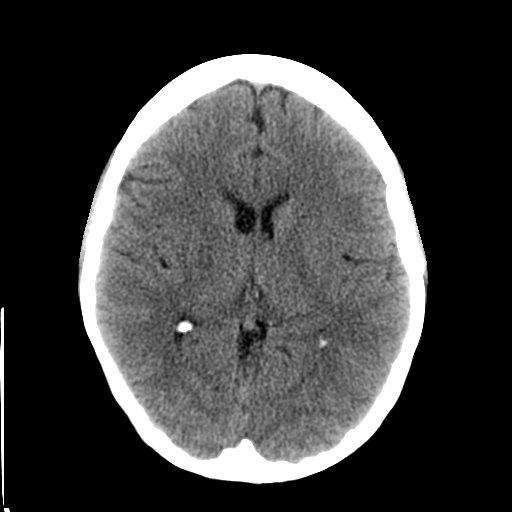
[im 17/32  brain]
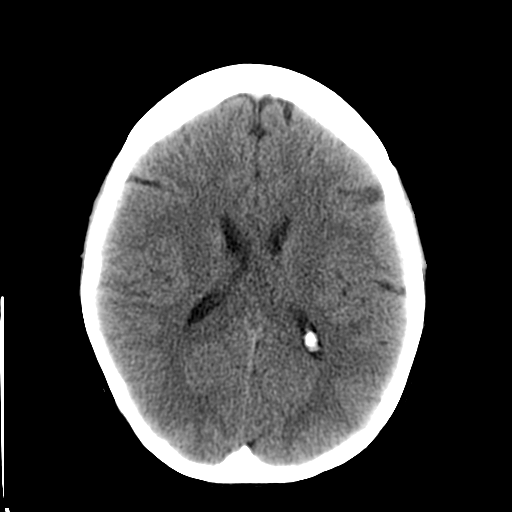
[im 17/32  bone]
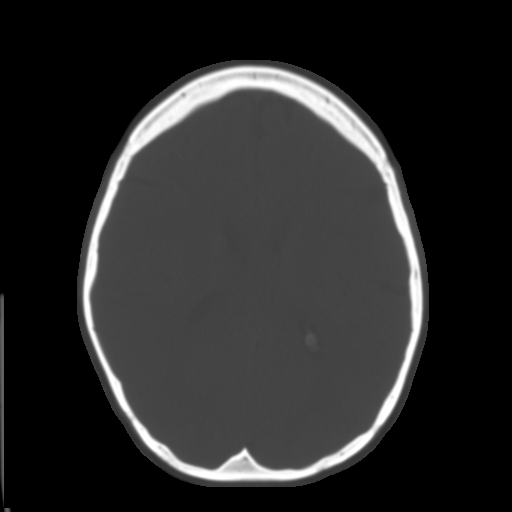
[im 19/32  brain]
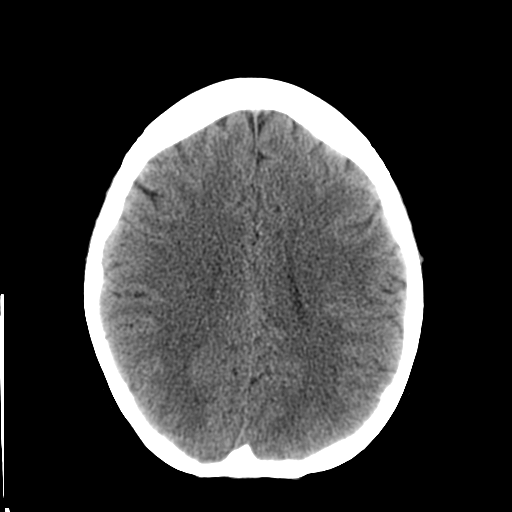
[im 21/32  brain]
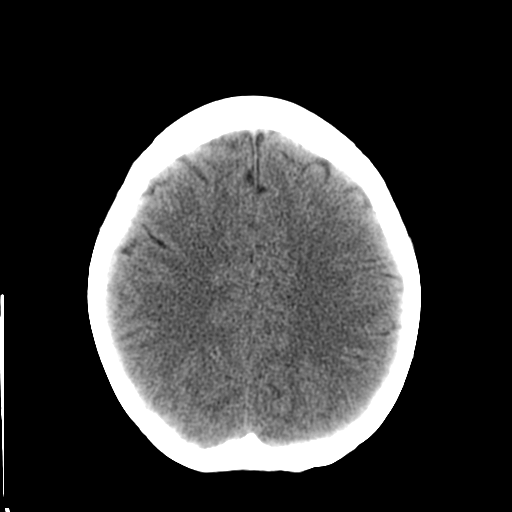
[im 23/32  brain]
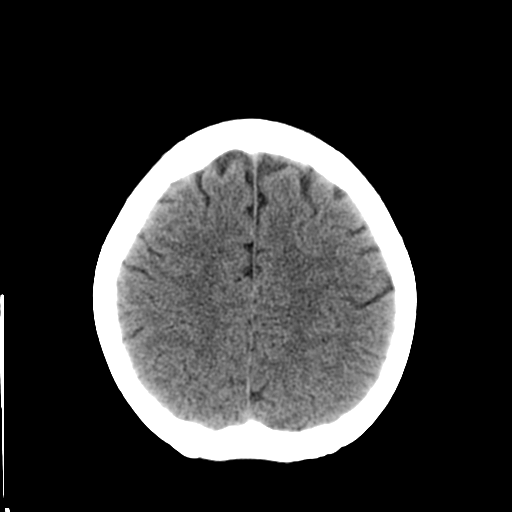
[im 24/32  brain]
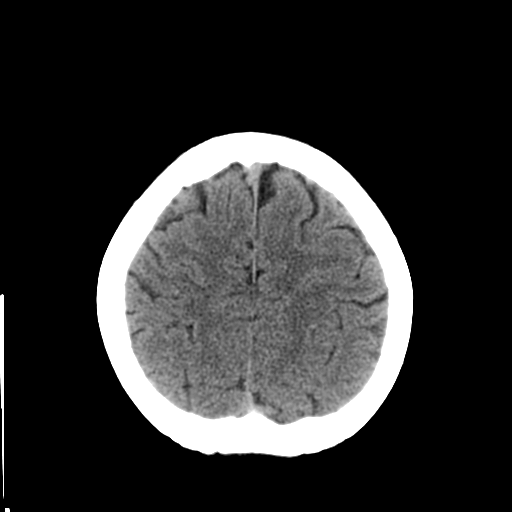
[im 24/32  bone]
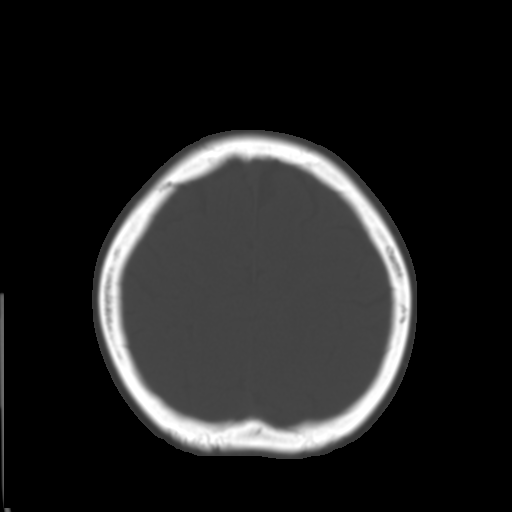
[im 26/32  brain]
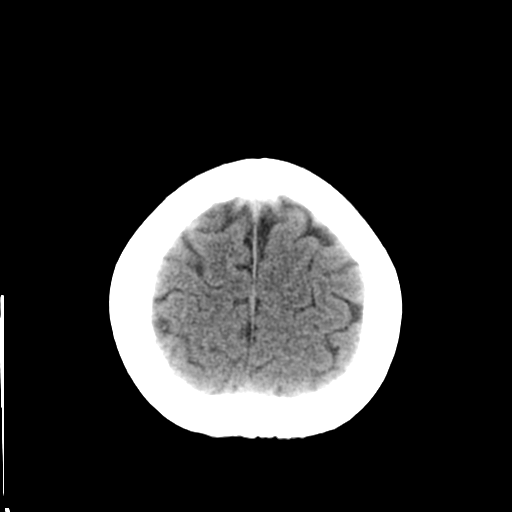
[im 28/32  brain]
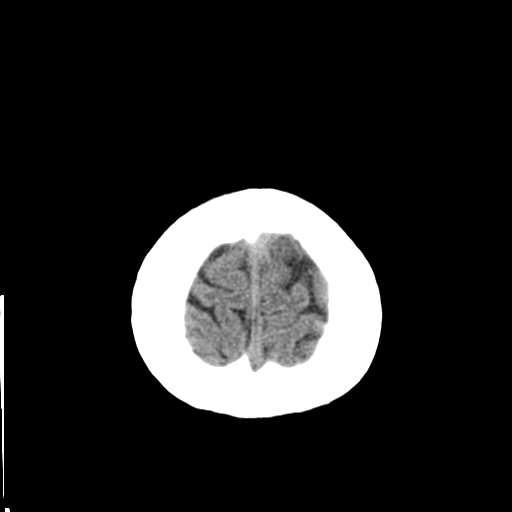
[im 30/32  brain]
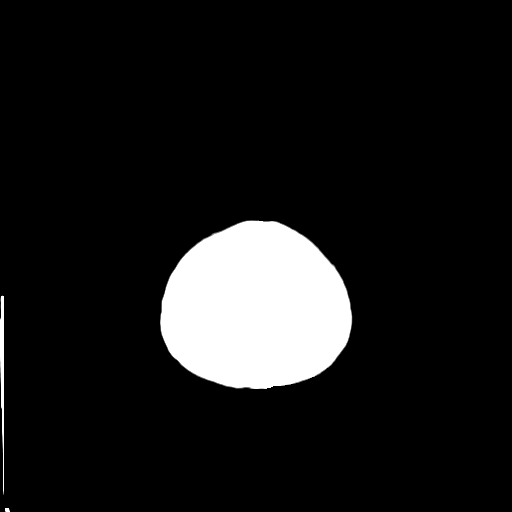

[16 of 30 positions shown; findings below may reference images not displayed]

FINDINGS: No skull fracture is noted. Paranasal sinuses and mastoid air cells
are unremarkable. No intracranial hemorrhage, mass effect or midline
shift. No acute cortical infarction. No mass lesion is noted on this
unenhanced scan. Ventricular size is stable from prior exam.
IMPRESSION: No acute intracranial abnormality.  No significant change.

## 2016-05-05 ENCOUNTER — Other Ambulatory Visit (INDEPENDENT_AMBULATORY_CARE_PROVIDER_SITE_OTHER): Payer: BLUE CROSS/BLUE SHIELD

## 2016-05-05 DIAGNOSIS — Z0000 Encounter for general adult medical examination without abnormal findings: Secondary | ICD-10-CM | POA: Diagnosis not present

## 2016-05-05 LAB — BASIC METABOLIC PANEL
BUN: 11 mg/dL (ref 6–23)
CALCIUM: 9.3 mg/dL (ref 8.4–10.5)
CHLORIDE: 103 meq/L (ref 96–112)
CO2: 27 meq/L (ref 19–32)
Creatinine, Ser: 0.62 mg/dL (ref 0.40–1.20)
GFR: 118.55 mL/min (ref >60.00)
GLUCOSE: 88 mg/dL (ref 70–99)
POTASSIUM: 3.5 meq/L (ref 3.5–5.1)
SODIUM: 139 meq/L (ref 135–145)

## 2016-05-05 LAB — POC URINALSYSI DIPSTICK (AUTOMATED)
Bilirubin, UA: NEGATIVE
Glucose, UA: NEGATIVE
Ketones, UA: NEGATIVE
Nitrite, UA: NEGATIVE
PH UA: 6
PROTEIN UA: NEGATIVE
RBC UA: NEGATIVE
SPEC GRAV UA: 1.015
UROBILINOGEN UA: 0.2

## 2016-05-05 LAB — CBC WITH DIFFERENTIAL/PLATELET
Basophils Absolute: 0 10*3/uL (ref 0.0–0.1)
Basophils Relative: 0.4 % (ref 0.0–3.0)
EOS PCT: 1.7 % (ref 0.0–5.0)
Eosinophils Absolute: 0.1 10*3/uL (ref 0.0–0.7)
HCT: 40.4 % (ref 36.0–46.0)
HEMOGLOBIN: 13.8 g/dL (ref 12.0–15.0)
Lymphocytes Relative: 35.8 % (ref 12.0–46.0)
Lymphs Abs: 3.2 10*3/uL (ref 0.7–4.0)
MCHC: 34.1 g/dL (ref 30.0–36.0)
MCV: 87.1 fl (ref 78.0–100.0)
MONO ABS: 0.8 10*3/uL (ref 0.1–1.0)
MONOS PCT: 8.9 % (ref 3.0–12.0)
Neutro Abs: 4.8 10*3/uL (ref 1.4–7.7)
Neutrophils Relative %: 53.2 % (ref 43.0–77.0)
Platelets: 302 10*3/uL (ref 150.0–400.0)
RBC: 4.64 Mil/uL (ref 3.87–5.11)
RDW: 14.2 % (ref 11.5–15.5)
WBC: 9 10*3/uL (ref 4.0–10.5)

## 2016-05-05 LAB — TSH: TSH: 1.97 u[IU]/mL (ref 0.35–4.50)

## 2016-05-05 LAB — LIPID PANEL
CHOL/HDL RATIO: 3
Cholesterol: 186 mg/dL (ref 0–200)
HDL: 54 mg/dL (ref >39.00)
LDL Cholesterol: 110 mg/dL — ABNORMAL HIGH (ref 0–99)
NONHDL: 131.73
TRIGLYCERIDES: 111 mg/dL (ref 0.0–149.0)
VLDL: 22.2 mg/dL (ref 0.0–40.0)

## 2016-05-05 LAB — HEPATIC FUNCTION PANEL
ALT: 14 U/L (ref 0–35)
AST: 15 U/L (ref 0–37)
Albumin: 4.2 g/dL (ref 3.5–5.2)
Alkaline Phosphatase: 57 U/L (ref 39–117)
BILIRUBIN DIRECT: 0 mg/dL (ref 0.0–0.3)
TOTAL PROTEIN: 7.4 g/dL (ref 6.0–8.3)
Total Bilirubin: 0.3 mg/dL (ref 0.2–1.2)

## 2016-05-10 ENCOUNTER — Encounter: Payer: Self-pay | Admitting: Family Medicine

## 2016-05-10 ENCOUNTER — Ambulatory Visit (INDEPENDENT_AMBULATORY_CARE_PROVIDER_SITE_OTHER): Payer: BLUE CROSS/BLUE SHIELD | Admitting: Family Medicine

## 2016-05-10 VITALS — BP 101/74 | HR 64 | Temp 99.1°F | Ht 63.0 in | Wt 130.0 lb

## 2016-05-10 DIAGNOSIS — Z0000 Encounter for general adult medical examination without abnormal findings: Secondary | ICD-10-CM | POA: Diagnosis not present

## 2016-05-10 NOTE — Progress Notes (Signed)
Pre visit review using our clinic review tool, if applicable. No additional management support is needed unless otherwise documented below in the visit note. 

## 2016-05-10 NOTE — Progress Notes (Signed)
   Subjective:    Patient ID: Brandi Wagner, female    DOB: 02/10/1986, 30 y.o.   MRN: [id number]  HPI 30 yr old female for Wagner well exam. She feels well but does mention some intermittent numbness in both hands. We worked this up Wagner few years ago with Wagner cervical spine MRI that was normal . At that time I offered to set her up for nerve conduction studies on the arms but she declined. There is no swelling or pain.    Review of Systems  Constitutional: Negative.   HENT: Negative.   Eyes: Negative.   Respiratory: Negative.   Cardiovascular: Negative.   Gastrointestinal: Negative.   Genitourinary: Negative for decreased urine volume, difficulty urinating, dyspareunia, dysuria, enuresis, flank pain, frequency, hematuria, pelvic pain and urgency.  Musculoskeletal: Negative.   Skin: Negative.   Neurological: Positive for numbness. Negative for dizziness, tremors, seizures, syncope, facial asymmetry, speech difficulty, weakness, light-headedness and headaches.  Psychiatric/Behavioral: Negative.        Objective:   Physical Exam  Constitutional: She is oriented to person, place, and time. She appears well-developed and well-nourished. No distress.  HENT:  Head: Normocephalic and atraumatic.  Right Ear: External ear normal.  Left Ear: External ear normal.  Nose: Nose normal.  Mouth/Throat: Oropharynx is clear and moist. No oropharyngeal exudate.  Eyes: Conjunctivae and EOM are normal. Pupils are equal, round, and reactive to light. No scleral icterus.  Neck: Normal range of motion. Neck supple. No JVD present. No thyromegaly present.  Cardiovascular: Normal rate, regular rhythm and intact distal pulses.  Exam reveals no gallop and no friction rub.   She has her usual 3/6 systolic murmur from the VSD  Pulmonary/Chest: Effort normal and breath sounds normal. No respiratory distress. She has no wheezes. She has no rales. She exhibits no tenderness.  Abdominal: Soft. Bowel sounds are normal. She  exhibits no distension and no mass. There is no tenderness. There is no rebound and no guarding.  Musculoskeletal: Normal range of motion. She exhibits no edema or tenderness.  Lymphadenopathy:    She has no cervical adenopathy.  Neurological: She is alert and oriented to person, place, and time. She has normal reflexes. No cranial nerve deficit. She exhibits normal muscle tone. Coordination normal.  Skin: Skin is warm and dry. No rash noted. No erythema.  Psychiatric: She has Wagner normal mood and affect. Her behavior is normal. Judgment and thought content normal.          Assessment & Plan:  Well exam. We discussed diet and exercise. She likely has mild carpal tunnel syndrome so I suggested she wear wrist splints each night.  Nelwyn SalisburyFRY,Brandi Wyss A, MD

## 2016-07-28 ENCOUNTER — Other Ambulatory Visit: Payer: Self-pay | Admitting: Obstetrics and Gynecology

## 2016-07-28 DIAGNOSIS — Z6824 Body mass index (BMI) 24.0-24.9, adult: Secondary | ICD-10-CM | POA: Diagnosis not present

## 2016-07-28 DIAGNOSIS — Z124 Encounter for screening for malignant neoplasm of cervix: Secondary | ICD-10-CM | POA: Diagnosis not present

## 2016-07-28 DIAGNOSIS — Z01419 Encounter for gynecological examination (general) (routine) without abnormal findings: Secondary | ICD-10-CM | POA: Diagnosis not present

## 2016-07-29 LAB — CYTOLOGY - PAP

## 2016-07-30 ENCOUNTER — Ambulatory Visit (INDEPENDENT_AMBULATORY_CARE_PROVIDER_SITE_OTHER): Payer: BLUE CROSS/BLUE SHIELD | Admitting: Family Medicine

## 2016-07-30 VITALS — BP 130/89 | HR 74 | Temp 98.9°F | Ht 63.0 in | Wt 135.0 lb

## 2016-07-30 DIAGNOSIS — R21 Rash and other nonspecific skin eruption: Secondary | ICD-10-CM

## 2016-07-30 MED ORDER — DOXYCYCLINE HYCLATE 100 MG PO CAPS: 100.0000 mg | ORAL_CAPSULE | Freq: Two times a day (BID) | ORAL | 0 refills | Status: DC

## 2016-07-30 MED ORDER — MUPIROCIN 2 % EX OINT: 1.0000 "application " | TOPICAL_OINTMENT | Freq: Two times a day (BID) | CUTANEOUS | 1 refills | Status: DC

## 2016-07-30 NOTE — Progress Notes (Signed)
Pre visit review using our clinic review tool, if applicable. No additional management support is needed unless otherwise documented below in the visit note. 

## 2016-07-31 ENCOUNTER — Encounter: Payer: Self-pay | Admitting: Family Medicine

## 2016-07-31 NOTE — Progress Notes (Signed)
   Subjective:    Patient ID: Brandi Wagner, female    DOB: 06/06/1986, 31 y.o.   MRN: [id number]  HPI Here for 10 months of a rash around the face. She has seen her GYN and also her Dermatologist, Dr. Yetta BarreJones, several times about this and has tried several topical agents including anti-acne creams like Retin-A. None of these have helped. She has tried Valtrex with no results. The rash is mildly painful. It does not drain.    Review of Systems  Constitutional: Negative.   Respiratory: Negative.   Cardiovascular: Negative.   Skin: Positive for rash.       Objective:   Physical Exam  Constitutional: She is oriented to person, place, and time. She appears well-developed and well-nourished.  HENT:  Right Ear: External ear normal.  Left Ear: External ear normal.  Mouth/Throat: Oropharynx is clear and moist.  There is a maculopapular rash which is red over the tip of the nose, over the upper lip, and over both cheeks  Eyes: Conjunctivae are normal.  Neck: No thyromegaly present.  Single enlarged node in the left anterior neck   Neurological: She is alert and oriented to person, place, and time.          Assessment & Plan:  This appears to be a Staph infection, possibly MRSA. She will use Bactroban ointment in the nostrils bid, also take Doxycycline bid for 30 days. Recheck prn. Gershon CraneStephen Fry, MD

## 2016-08-05 ENCOUNTER — Telehealth: Payer: Self-pay | Admitting: Family Medicine

## 2016-08-05 ENCOUNTER — Encounter: Payer: Self-pay | Admitting: Family Medicine

## 2016-08-05 NOTE — Telephone Encounter (Signed)
Pt will be speaking with TeleHealth. Nothing further needed at this time.

## 2016-08-05 NOTE — Telephone Encounter (Signed)
Tell her to always have a full stomach before taking the medication. Also she can try taking Emetrol (OTC) about 30 minutes before taking it.

## 2016-08-05 NOTE — Telephone Encounter (Signed)
Please see message and advise 

## 2016-08-05 NOTE — Telephone Encounter (Signed)
PLEASE NOTE: All timestamps contained within this report are represented as [place] Standard Time. CONFIDENTIALTY NOTICE: This fax transmission is intended only for the addressee. It contains information that is legally privileged, confidential or otherwise protected from use or disclosure. If you are not the intended recipient, you are strictly prohibited from reviewing, disclosing, copying using or disseminating any of this information or taking any action in reliance on or regarding this information. If you have received this fax in error, please notify us immediately by telephone so that we can arrange for its return to us. Phone: [phone], Toll-Free: [phone], Fax: [phone] Page: 1 of 1 Call Id: [id number] [name] TELEPHONE ADVICE RECORD [facility] Patient Name: Brandi HooverCARRIE Wagner DOB: 04/11/1986 Initial Comment Caller states she was given an antibiotic for acne, and it's making her nausea. Nurse Assessment Nurse: Loletta Specterorbin, RN, Misty StanleyLisa Date/Time Lamount Cohen(Eastern Time): 08/05/2016 9:14:53 AM Confirm and document reason for call. If symptomatic, describe symptoms. ---Caller states her MD put her on doxycycline for acne and it makes her nauseated if she does not eat enough. It is a 30 day supply, she wants to know if something can be called in for nausea or any recommendations to help with nausea. Does the patient have any new or worsening symptoms? ---Yes Will a triage be completed? ---Yes Related visit to physician within the last 2 weeks? ---No Does the PT have any chronic conditions? (i.e. diabetes, asthma, etc.) ---No Is the patient pregnant or possibly pregnant? (Ask all females between the ages of 4212-55) ---No Is this a behavioral health or substance abuse call? ---No Guidelines Guideline Title Affirmed Question Affirmed Notes Nausea Taking prescription medication that could cause nausea (e.g., narcotics/opiates, antibiotics,  OCPs, many others) Final Disposition User Call PCP within 24 Hours Loletta Specterorbin, RN, [employer]Lisa Comments Caller declined appointment, requests telehealth option. Email for telehealth sent to caller at [email or web address], [email or web address] per her request. Referrals REFERRED TO PCP OFFICE Disagree/Comply: Disagree Disagree/Comply Reason: Disagree with instructions

## 2016-09-24 ENCOUNTER — Telehealth: Payer: Self-pay | Admitting: Family Medicine

## 2016-09-24 NOTE — Telephone Encounter (Signed)
Pt need a refill on doxycycline for acne send to [facility]. Please call pt when rx has been sent

## 2016-09-24 NOTE — Telephone Encounter (Signed)
Let's decrease the dose to just one 100 mg tab a day. Cal in #30 with 5 rf

## 2016-09-27 MED ORDER — DOXYCYCLINE HYCLATE 100 MG PO CAPS: 100.0000 mg | ORAL_CAPSULE | Freq: Every day | ORAL | 5 refills | Status: DC

## 2016-09-27 NOTE — Telephone Encounter (Signed)
I sent script e-scribe to [facility] and spoke with pt.  

## 2016-12-27 ENCOUNTER — Encounter: Payer: Self-pay | Admitting: Family Medicine

## 2016-12-27 ENCOUNTER — Ambulatory Visit (INDEPENDENT_AMBULATORY_CARE_PROVIDER_SITE_OTHER): Payer: BLUE CROSS/BLUE SHIELD | Admitting: Family Medicine

## 2016-12-27 VITALS — BP 120/80 | Temp 98.5°F | Ht 63.0 in | Wt 134.0 lb

## 2016-12-27 DIAGNOSIS — J019 Acute sinusitis, unspecified: Secondary | ICD-10-CM

## 2016-12-27 MED ORDER — AZITHROMYCIN 250 MG PO TABS: ORAL_TABLET | ORAL | 0 refills | Status: DC

## 2016-12-27 MED ORDER — HYDROCODONE-HOMATROPINE 5-1.5 MG/5ML PO SYRP: 5.0000 mL | ORAL_SOLUTION | ORAL | 0 refills | Status: DC | PRN

## 2016-12-27 NOTE — Patient Instructions (Signed)
WE NOW OFFER   Brandi Wagner's FAST TRACK!!!  SAME DAY Appointments for ACUTE CARE  Such as: Sprains, Injuries, cuts, abrasions, rashes, muscle pain, joint pain, back pain Colds, flu, sore throats, headache, allergies, cough, fever  Ear pain, sinus and eye infections Abdominal pain, nausea, vomiting, diarrhea, upset stomach Animal/insect bites  3 Easy Ways to Schedule: Walk-In Scheduling Call in scheduling Mychart Sign-up: [email or web address]         

## 2016-12-27 NOTE — Progress Notes (Signed)
   Subjective:    Patient ID: Brandi Wagner, female    DOB: 05/08/1986, 31 y.o.   MRN: [id number]  HPI Here for 5 days of sinus pressure, PND, and a dry cough. Using Robitussin.    Review of Systems  Constitutional: Negative.   HENT: Positive for congestion, postnasal drip and sinus pressure. Negative for sinus pain and sore throat.   Eyes: Negative.   Respiratory: Positive for cough.        Objective:   Physical Exam  Constitutional: She appears well-developed and well-nourished.  HENT:  Right Ear: External ear normal.  Left Ear: External ear normal.  Nose: Nose normal.  Mouth/Throat: Oropharynx is clear and moist.  Eyes: Conjunctivae are normal.  Neck: No thyromegaly present.  Pulmonary/Chest: Effort normal and breath sounds normal. No respiratory distress. She has no wheezes. She has no rales.  Lymphadenopathy:    She has no cervical adenopathy.          Assessment & Plan:  Sinusitis, treat with a Zpack. Written out of work today.  Gershon CraneStephen Jayleon Mcfarlane, MD

## 2017-02-28 ENCOUNTER — Ambulatory Visit: Payer: BLUE CROSS/BLUE SHIELD | Admitting: Family Medicine

## 2017-02-28 ENCOUNTER — Emergency Department (HOSPITAL_COMMUNITY): Payer: BLUE CROSS/BLUE SHIELD

## 2017-02-28 ENCOUNTER — Encounter (HOSPITAL_COMMUNITY): Payer: Self-pay

## 2017-02-28 ENCOUNTER — Emergency Department (HOSPITAL_COMMUNITY)
Admission: EM | Admit: 2017-02-28 | Discharge: 2017-02-28 | Disposition: A | Discharge status: Home / Self Care | Payer: BLUE CROSS/BLUE SHIELD | Attending: Emergency Medicine | Admitting: Emergency Medicine

## 2017-02-28 DIAGNOSIS — R079 Chest pain, unspecified: Secondary | ICD-10-CM | POA: Diagnosis not present

## 2017-02-28 DIAGNOSIS — R55 Syncope and collapse: Secondary | ICD-10-CM

## 2017-02-28 DIAGNOSIS — R011 Cardiac murmur, unspecified: Secondary | ICD-10-CM | POA: Insufficient documentation

## 2017-02-28 DIAGNOSIS — R0789 Other chest pain: Secondary | ICD-10-CM | POA: Diagnosis not present

## 2017-02-28 LAB — CBC
HEMATOCRIT: 40.1 % (ref 36.0–46.0)
HEMOGLOBIN: 13.6 g/dL (ref 12.0–15.0)
MCH: 29.6 pg (ref 26.0–34.0)
MCHC: 33.9 g/dL (ref 30.0–36.0)
MCV: 87.4 fL (ref 78.0–100.0)
Platelets: 297 10*3/uL (ref 150–400)
RBC: 4.59 MIL/uL (ref 3.87–5.11)
RDW: 12.7 % (ref 11.5–15.5)
WBC: 7.7 10*3/uL (ref 4.0–10.5)

## 2017-02-28 LAB — I-STAT TROPONIN, ED: Troponin i, poc: 0 ng/mL (ref 0.00–0.08)

## 2017-02-28 LAB — BASIC METABOLIC PANEL
Anion gap: 8 (ref 5–15)
BUN: 7 mg/dL (ref 6–20)
CHLORIDE: 105 mmol/L (ref 101–111)
CO2: 24 mmol/L (ref 22–32)
Calcium: 9.4 mg/dL (ref 8.9–10.3)
Creatinine, Ser: 0.69 mg/dL (ref 0.44–1.00)
GFR calc Af Amer: >60 mL/min (ref >60)
GFR calc non Af Amer: >60 mL/min (ref >60)
GLUCOSE: 104 mg/dL — AB (ref 65–99)
POTASSIUM: 3.9 mmol/L (ref 3.5–5.1)
SODIUM: 137 mmol/L (ref 135–145)

## 2017-02-28 NOTE — ED Triage Notes (Signed)
Pt presents for evaluation of central chest tightness today. Pt reports initially was sitting at work and began feeling "tunnel vision", lightheaded and dizziness. Denies N/V. Endorses radiation to back. Pt reports what she believed to be panic attack last week, not taking anxiety medication. 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

## 2017-02-28 NOTE — ED Notes (Signed)
Pt verbalized understanding discharge instructions and denies any further needs or questions at this time. VS stable, ambulatory and steady gait.   

## 2017-02-28 NOTE — ED Provider Notes (Signed)
[facility] Provider Note   CSN: [id number] Arrival date & time: 02/28/17  1325     History   Chief Complaint Chief Complaint  Patient presents with  . Chest Pain    HPI Brandi Wagner is a 31 y.o. female.  The history is provided by the patient. No language interpreter was used.  Chest Pain      Brandi Wagner is a 31 y.o. female who presents to the Emergency Department complaining of chest pain, near syncope. She reports some lightheadedness and chest tightness that started today. She was working under desk when she developed lightheadedness and a pressure in the back of her head with a feeling like she might pass out. She had associated chest tightness and felt like she needed to take a deep breath but no particular shortness of breath. Her symptoms feel better when she walked outside into warmer air. No fevers. No nausea, vomiting, abdominal pain, leg swelling or pain. She has experienced similar symptoms in the past and attributed it to a panic attack but she does not feel anxious or like she has had a panic attack. She does have a history of VSD and is followed by cardiology with her last echo being 2 years ago. Her mother has a history of cerebral aneurysm and her father has a history of MI at the age of 42.  Past Medical History:  Diagnosis Date  . Allergic rhinitis   . Eczema   . GERD (gastroesophageal reflux disease)   . Headache(784.0)   . Ventricular septal defect    sees Dr. Dietrich Pates     Patient Active Problem List   Diagnosis Date Noted  . Shingles 06/13/2015  . ECZEMA 09/16/2009  . VENTRICULAR SEPTAL DEFECT 09/16/2009  . FATIGUE 09/16/2009  . ALLERGIC RHINITIS 11/27/2007  . GERD 05/09/2007  . HEADACHE 05/09/2007  . CARDIAC MURMUR, HX OF 05/09/2007    Past Surgical History:  Procedure Laterality Date  . CHOLECYSTECTOMY      OB History    No data available       Home Medications    Prior to Admission medications   Medication Sig Start  Date End Date Taking? Authorizing Provider  azithromycin (ZITHROMAX) 250 MG tablet As directed 12/27/16   Nelwyn Salisbury, MD  doxycycline (VIBRAMYCIN) 100 MG capsule Take 1 capsule (100 mg total) by mouth daily. 09/27/16   Nelwyn Salisbury, MD  HYDROcodone-homatropine (HYDROMET) 5-1.5 MG/5ML syrup Take 5 mLs by mouth every 4 (four) hours as needed. 12/27/16   Nelwyn Salisbury, MD    Family History Family History  Problem Relation Age of Onset  . Hypertension Mother   . Anuerysm Mother        Brain  . Hypertension Unknown     Social History Social History  Substance Use Topics  . Smoking status: Never Smoker  . Smokeless tobacco: Never Used  . Alcohol use 1.2 oz/week    2 Standard drinks or equivalent per week     Comment: weekends     Allergies   Promethazine and Promethazine hcl   Review of Systems Review of Systems  Cardiovascular: Positive for chest pain.  All other systems reviewed and are negative.    Physical Exam Updated Vital Signs BP 135/87   Pulse 80   Temp 97.9 F (36.6 C) (Oral)   Resp 18   LMP 02/21/2017 (Exact Date)   SpO2 98%   Physical Exam  Constitutional: She is oriented to person,  place, and time. She appears well-developed and well-nourished.  HENT:  Head: Normocephalic and atraumatic.  Cardiovascular: Normal rate and regular rhythm.   Murmur heard. SEM  Pulmonary/Chest: Effort normal and breath sounds normal. No respiratory distress.  Abdominal: Soft. There is no tenderness. There is no rebound and no guarding.  Musculoskeletal: She exhibits no edema or tenderness.  Neurological: She is alert and oriented to person, place, and time.  Skin: Skin is warm and dry.  Psychiatric: She has a normal mood and affect. Her behavior is normal.  Nursing note and vitals reviewed.    ED Treatments / Results  Labs (all labs ordered are listed, but only abnormal results are displayed) Labs Reviewed  BASIC METABOLIC PANEL - Abnormal; Notable for the  following:       Result Value   Glucose, Bld 104 (*)    All other components within normal limits  CBC  I-STAT TROPONIN, ED    EKG  EKG Interpretation  Date/Time:  Monday February 28 2017 13:35:11 EDT Ventricular Rate:  81 PR Interval:  134 QRS Duration: 130 QT Interval:  382 QTC Calculation: 443 R Axis:   67 Text Interpretation:  Normal sinus rhythm Right bundle branch block Abnormal ECG Confirmed by Tilden Fossaees, Jaishaun Mcnab [id number]) on 02/28/2017 5:31:49 PM       Radiology Dg Chest 2 View  Result Date: 02/28/2017 CLINICAL DATA:  Chest tightness EXAM: CHEST  2 VIEW COMPARISON:  11/21/2006 FINDINGS: Right-sided aortic arch 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 Lungs are clear. Heart is normal in size. No pneumothorax or pleural effusion. 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 This can be associated with congenital anomalies. No acute cardiopulmonary disease. Electronically Signed   By: Jolaine ClickArthur  Hoss M.D.   On: 02/28/2017 14:05    Procedures Procedures (including critical care time)  Medications Ordered in ED Medications - No data to display   Initial Impression / Assessment and Plan / ED Course  I have reviewed the triage vital signs and the nursing notes.  Pertinent labs & imaging results that were available during my care of the patient were reviewed by me and considered in my medical decision making (see chart for details).     Pt with hx/o VSD here with near syncopal sensation, head pressure.  sxs improved in ED.  Current presentation not c/w ACS, PE, aneurysm.  Discussed cardiology follow up.    Counseled patient on home care, outpatient follow up and return precautions.   Final Clinical Impressions(s) / ED Diagnoses   Final diagnoses:  Near syncope    New Prescriptions Discharge Medication List as of 02/28/2017  6:08 PM       Tilden Fossaees, Kawika Bischoff, MD 02/28/17 2118

## 2017-03-01 ENCOUNTER — Telehealth: Payer: Self-pay | Admitting: Internal Medicine

## 2017-03-01 ENCOUNTER — Other Ambulatory Visit: Payer: Self-pay | Admitting: Internal Medicine

## 2017-03-01 NOTE — Telephone Encounter (Signed)
Patient Mother calling, states that patient was seen in ED yesterday for dizziness and was told that she needed to follow-up with cardiologist stat by Dr. Olivia CanterElizabeth Reese. I checked availability with Dr. Tenny Crawoss and all APP's

## 2017-03-01 NOTE — Telephone Encounter (Signed)
Spoke with patient.  Yesterday she went to ED because she became dizzy at her desk at work, "brain fog", no tunnel vision, a little bit of chest tightness and needed to take a deep breath to be able to catch her breath.  Had panic attack a week ago--her shoulders were burning, arms became numb, had tunnel vision.  There was no trigger that she knew of.     Pt has VSD with last echo 06/15/2015,   Will plan for patient to see Dr. Tenny Crawoss on 03/17/17 at 11:40 am.

## 2017-03-09 ENCOUNTER — Encounter: Payer: Self-pay | Admitting: Internal Medicine

## 2017-03-16 ENCOUNTER — Encounter: Payer: Self-pay | Admitting: Internal Medicine

## 2017-03-16 NOTE — Progress Notes (Signed)
Cardiology Office Note   Date:  03/17/2017   ID:  [name], DOB 33-Mar-1987, MRN [id number]  PCP:  Nelwyn Salisbury, MD  Cardiologist:   Dietrich Pates, MD   Pt presents for f/u of presyncope and chest tightness    History of Present Illness: Brandi Wagner is a 31 y.o. female with a history of VSD (small), I saw her in November   When I saw her she had some dizziness, Seeing spots  BP was elevated at primary   HR was up here  I recomm inderal 10mg  bid   Since seen she says she may feel a little better  Some blurriness L eye started yesterda    I saw the pt in Dec 2016 She was seen in ED for near syncope on Feb 28 2017  Working at time  Lightheaded  SOme chest tightness,   Pt has spells of dizziness, chest tightness, burning in back and numbness in back and both arms The day she had spell that sent her to ED it occurred about 11 AM   Had breakfast that day (chicken minis  [employer]  From [employer])  Says she drinks a lot of water during the day    SInce the day of ED visit she  has not had dizziness   Over past couple days still SOB  Not tight  Needs to take a big brathg to catch a breath   Fatigued over the past month    Naps a lot    Current Outpatient Prescriptions  Medication Sig Dispense Refill  . doxycycline (VIBRAMYCIN) 100 MG capsule Take 1 capsule (100 mg total) by mouth daily. 30 capsule 5   No current facility-administered medications for this visit.     Allergies:   Promethazine; Promethazine hcl; and Other   Past Medical History:  Diagnosis Date  . Allergic rhinitis   . Eczema   . GERD (gastroesophageal reflux disease)   . Headache(784.0)   . Ventricular septal defect    sees Dr. Dietrich Pates     Past Surgical History:  Procedure Laterality Date  . CHOLECYSTECTOMY       Social History:  The patient  reports that she has never smoked. She has never used smokeless tobacco. She reports that she drinks about 1.2 oz of alcohol per week . She reports that  she does not use drugs.   Family History:  The patient's family history includes Anuerysm in her mother; Hypertension in her mother and unknown relative.    ROS:  Please see the history of present illness. All other systems are reviewed and  Negative to the above problem except as noted.    PHYSICAL EXAM: VS:  BP 112/84   Pulse 72   Ht 5\' 3"  (1.6 m)   Wt 139 lb (63 kg)   LMP 02/21/2017 (Exact Date)   SpO2 99%   BMI 24.62 kg/m   GEN: Well nourished, well developed, in no acute distress  HEENT: normal  Neck: no JVD, carotid bruits, or masses Cardiac: RRR Gr III/VI systolic murmjr LSB   rubs, or gallops,no edema  Respiratory:  clear to auscultation bilaterally, normal work of breathing GI: soft, nontender, nondistended, + BS  No hepatomegaly  MS: no deformity Moving all extremities   Skin: warm and dry, no rash Neuro:  Strength and sensation are intact Psych: euthymic mood, full affect   EKG:  EKG is not ordered today.   Lipid Panel  Component Value Date/Time   CHOL 186 05/05/2016 0822   TRIG 111.0 05/05/2016 0822   HDL 54.00 05/05/2016 0822   CHOLHDL 3 05/05/2016 0822   VLDL 22.2 05/05/2016 0822   LDLCALC 110 (H) 05/05/2016 0822      Wt Readings from Last 3 Encounters:  03/17/17 139 lb (63 kg)  12/27/16 134 lb (60.8 kg)  07/30/16 135 lb (61.2 kg)      ASSESSMENT AND PLAN: 1  Spells  Dizziness, palpitations  HR increases with standing  She has had this before   Was on inderal which she stopped I would add back to her regimen Other part of spell sounds like panic (bilateral arm numbness) Prob precipitated when BP low   Follow Check TSH  2  Hx VSD  REcheck echo  F?U in 6 to 8 wks Encouraged her to get back into her exercise routine.   Signed, Dietrich Pates, MD  03/17/2017 11:56 AM    [facility] [address] [place], [place], [place]  [address] Phone: [phone]; Fax: [phone]

## 2017-03-17 ENCOUNTER — Ambulatory Visit (INDEPENDENT_AMBULATORY_CARE_PROVIDER_SITE_OTHER): Payer: BLUE CROSS/BLUE SHIELD | Admitting: Internal Medicine

## 2017-03-17 ENCOUNTER — Encounter: Payer: Self-pay | Admitting: Internal Medicine

## 2017-03-17 VITALS — BP 112/84 | HR 72 | Ht 63.0 in | Wt 139.0 lb

## 2017-03-17 DIAGNOSIS — Q210 Ventricular septal defect: Secondary | ICD-10-CM | POA: Diagnosis not present

## 2017-03-17 DIAGNOSIS — R42 Dizziness and giddiness: Secondary | ICD-10-CM | POA: Diagnosis not present

## 2017-03-17 MED ORDER — PROPRANOLOL HCL 10 MG PO TABS: ORAL_TABLET | ORAL | 6 refills | Status: DC

## 2017-03-17 NOTE — Patient Instructions (Addendum)
Your physician has recommended you make the following change in your medication:  1.) inderal 5-10 mg (0.5 to 1 tablet) twice a day  Your physician recommends that you return for lab work today [facility])  Your physician has requested that you have an echocardiogram. Echocardiography is a painless test that uses sound waves to create images of your heart. It provides your doctor with information about the size and shape of your heart and how well your heart's chambers and valves are working. This procedure takes approximately one hour. There are no restrictions for this procedure.  Your physician recommends that you schedule a follow-up appointment in: 2 months with Dr. Tenny Craw.

## 2017-03-18 LAB — TSH: TSH: 1.65 u[IU]/mL (ref 0.450–4.500)

## 2017-04-01 ENCOUNTER — Ambulatory Visit: Payer: BLUE CROSS/BLUE SHIELD | Admitting: Internal Medicine

## 2017-04-11 ENCOUNTER — Other Ambulatory Visit (HOSPITAL_COMMUNITY): Payer: BLUE CROSS/BLUE SHIELD

## 2017-04-14 ENCOUNTER — Encounter: Payer: Self-pay | Admitting: Family Medicine

## 2017-05-06 ENCOUNTER — Telehealth (HOSPITAL_COMMUNITY): Payer: Self-pay | Admitting: Internal Medicine

## 2017-05-06 NOTE — Telephone Encounter (Signed)
User: Trina Ao A Date/time: 05/06/2017 11:18 AM  Comment: Called pt and voicemail was full...RG  Context: [place] Schedule Orders/Appt Requests Outcome: No Answer/Busy  Phone number: [phone] Phone Type: Home Phone  Comm. type: Telephone Call type: Outgoing  Contact: Darrel Hoover E Relation to patient: Self  Letter:      User: Trina Ao A Date/time: 04/13/2017 10:25 AM  Comment: Patient stated that she will CB next week to r/s.Marland KitchenRG  Context: [place] Schedule Orders/Appt Requests Outcome: Completed  Phone number: [phone] Phone Type: Home Phone  Comm. type: Telephone Call type: Outgoing  Contact: Darrel Hoover E Relation to patient: Self  Letter:

## 2017-06-06 ENCOUNTER — Ambulatory Visit: Payer: BLUE CROSS/BLUE SHIELD | Admitting: Internal Medicine

## 2018-02-04 IMAGING — DX DG CHEST 2V
2 series · 2 of 2 positions shown · non-contrast
Comparison: 11/21/2006

CLINICAL DATA: Chest tightness

EXAM:
CHEST  2 VIEW

[w chest pa]
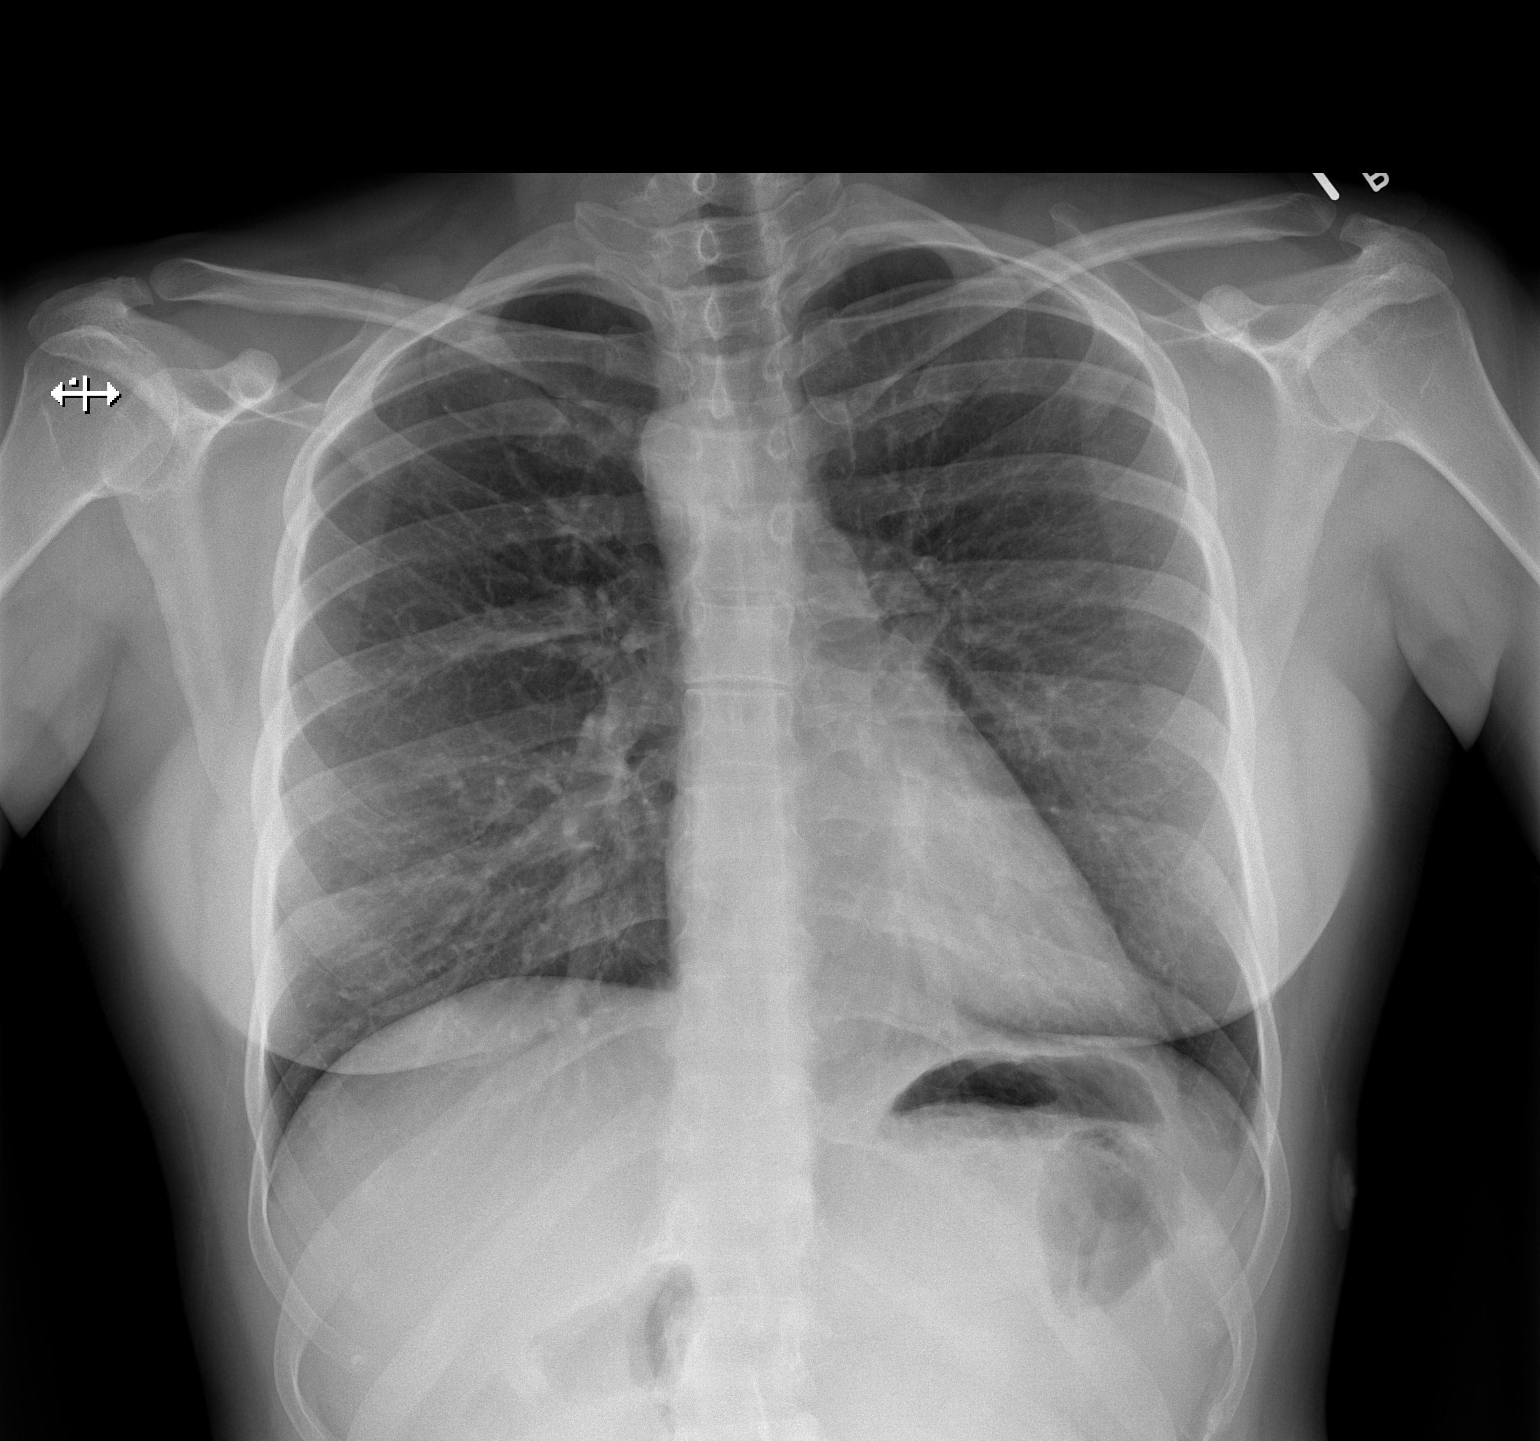

[w chest lat]
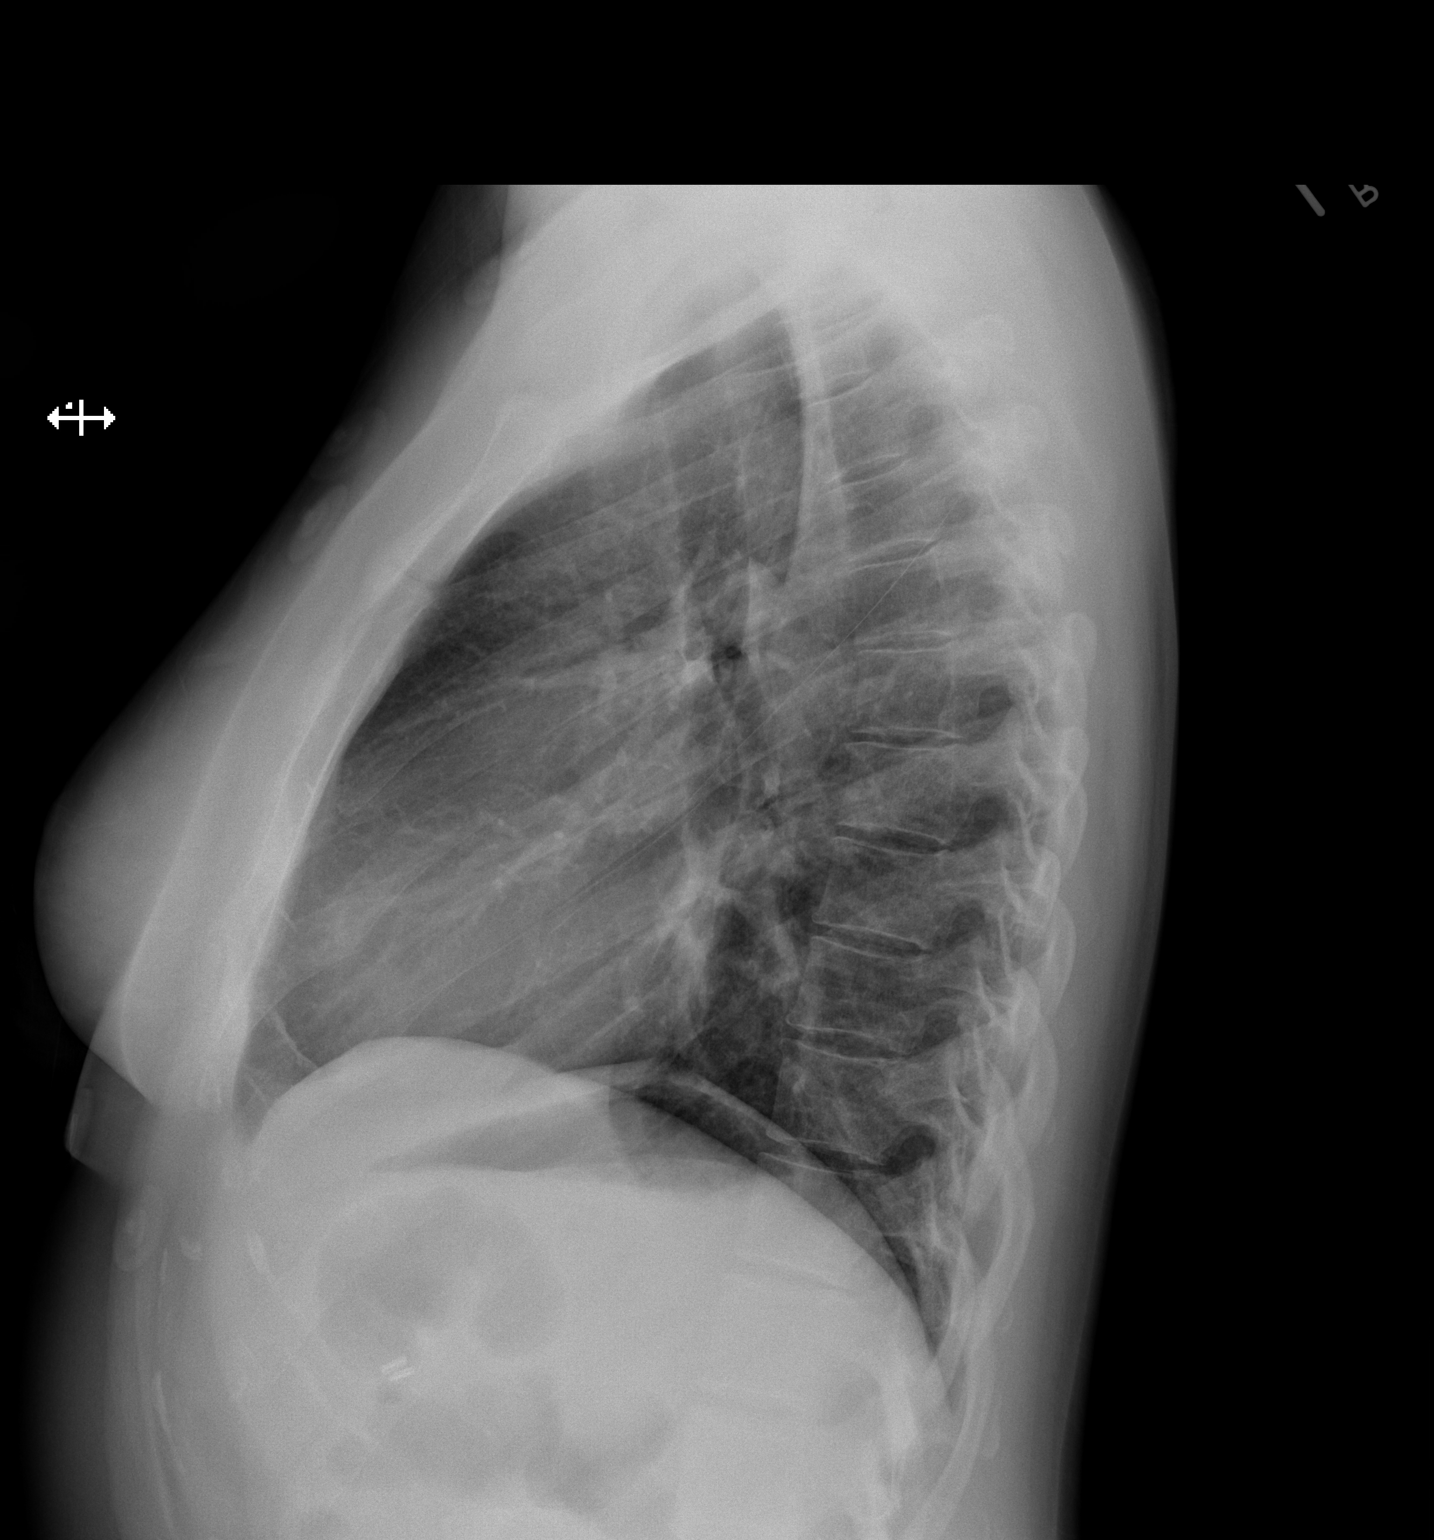

[2 of 2 positions shown; findings below may reference images not displayed]

FINDINGS: Right-sided aortic arch anatomy is suspected. Lungs are clear. Heart
is normal in size. No pneumothorax or pleural effusion.
IMPRESSION: Right-sided aortic arch anatomy is suspected. This can be associated
with congenital anomalies.

No acute cardiopulmonary disease.

## 2018-02-20 ENCOUNTER — Encounter: Payer: Self-pay | Admitting: Family Medicine

## 2018-02-20 ENCOUNTER — Ambulatory Visit: Payer: 59 | Admitting: Family Medicine

## 2018-02-20 VITALS — BP 108/70 | HR 101 | Temp 99.5°F | Ht 63.0 in | Wt 148.2 lb

## 2018-02-20 DIAGNOSIS — J209 Acute bronchitis, unspecified: Secondary | ICD-10-CM | POA: Diagnosis not present

## 2018-02-20 MED ORDER — HYDROCODONE-HOMATROPINE 5-1.5 MG/5ML PO SYRP: 5.0000 mL | ORAL_SOLUTION | ORAL | 0 refills | Status: DC | PRN

## 2018-02-20 MED ORDER — AZITHROMYCIN 250 MG PO TABS: ORAL_TABLET | ORAL | 0 refills | Status: DC

## 2018-02-20 NOTE — Progress Notes (Signed)
   Subjective:    Patient ID: Brandi Wagner, female    DOB: 06/22/1986, 32 y.o.   MRN: [id number]  HPI Here for one week of PND, ST, and a dry cough. Some low grade fevers. Using Nyquil and Mucinex.    Review of Systems  Constitutional: Negative.   HENT: Positive for congestion, postnasal drip and sore throat. Negative for sinus pressure and sneezing.   Eyes: Negative.   Respiratory: Positive for cough and chest tightness. Negative for wheezing.        Objective:   Physical Exam  Constitutional: She appears well-developed and well-nourished.  HENT:  Right Ear: External ear normal.  Left Ear: External ear normal.  Nose: Nose normal.  Mouth/Throat: Oropharynx is clear and moist.  Eyes: Conjunctivae are normal.  Neck: No thyromegaly present.  Pulmonary/Chest: Effort normal. No stridor. No respiratory distress. She has no wheezes.  Scattered rhonchi   Lymphadenopathy:    She has no cervical adenopathy.          Assessment & Plan:  Bronchitis, treat with a Zpack.  Gershon CraneStephen Fry, MD

## 2018-05-16 DIAGNOSIS — Z124 Encounter for screening for malignant neoplasm of cervix: Secondary | ICD-10-CM | POA: Diagnosis not present

## 2018-05-16 DIAGNOSIS — Z01419 Encounter for gynecological examination (general) (routine) without abnormal findings: Secondary | ICD-10-CM | POA: Diagnosis not present

## 2018-06-02 ENCOUNTER — Encounter: Payer: 59 | Admitting: Family Medicine

## 2018-07-21 ENCOUNTER — Encounter: Payer: Self-pay | Admitting: Family Medicine

## 2018-07-21 ENCOUNTER — Ambulatory Visit (INDEPENDENT_AMBULATORY_CARE_PROVIDER_SITE_OTHER): Payer: 59 | Admitting: Family Medicine

## 2018-07-21 VITALS — BP 116/82 | HR 78 | Temp 98.6°F | Ht 63.0 in | Wt 149.6 lb

## 2018-07-21 DIAGNOSIS — Z0000 Encounter for general adult medical examination without abnormal findings: Secondary | ICD-10-CM | POA: Diagnosis not present

## 2018-07-21 DIAGNOSIS — M79601 Pain in right arm: Secondary | ICD-10-CM | POA: Diagnosis not present

## 2018-07-21 DIAGNOSIS — M79602 Pain in left arm: Secondary | ICD-10-CM

## 2018-07-21 MED ORDER — DOXYCYCLINE HYCLATE 100 MG PO CAPS: 100.0000 mg | ORAL_CAPSULE | Freq: Every day | ORAL | 5 refills | Status: DC

## 2018-07-21 NOTE — Progress Notes (Signed)
Subjective:    Patient ID: Brandi Wagner, female    DOB: 09/09/1985, 32 y.o.   MRN: [id number]  HPI Here for a well exam. She has a few complaints. First for the past 2 months she has had frequent sharp pains in the posterior neck that radiate down both arms. They sometimes get numb and they tingle, but there is no weakness. Of note in 2014 she had an MRI of the cervical spine that showed a small syrinx at the C6-T1 level. Also she has not seen Dr. Tenny Crawoss for a cardiology follow for several years.    Review of Systems  Constitutional: Negative.   HENT: Negative.   Eyes: Negative.   Respiratory: Negative.   Cardiovascular: Negative.   Gastrointestinal: Negative.   Genitourinary: Negative for decreased urine volume, difficulty urinating, dyspareunia, dysuria, enuresis, flank pain, frequency, hematuria, pelvic pain and urgency.  Musculoskeletal: Positive for neck pain.  Skin: Negative.   Neurological: Negative.   Psychiatric/Behavioral: Negative.        Objective:   Physical Exam Constitutional:      General: She is not in acute distress.    Appearance: She is well-developed.  HENT:     Head: Normocephalic and atraumatic.     Right Ear: External ear normal.     Left Ear: External ear normal.     Nose: Nose normal.     Mouth/Throat:     Pharynx: No oropharyngeal exudate.  Eyes:     General: No scleral icterus.    Conjunctiva/sclera: Conjunctivae normal.     Pupils: Pupils are equal, round, and reactive to light.  Neck:     Musculoskeletal: Normal range of motion and neck supple.     Thyroid: No thyromegaly.     Vascular: No JVD.  Cardiovascular:     Rate and Rhythm: Normal rate and regular rhythm.     Heart sounds: Normal heart sounds. No murmur. No friction rub. No gallop.   Pulmonary:     Effort: Pulmonary effort is normal. No respiratory distress.     Breath sounds: Normal breath sounds. No wheezing or rales.  Chest:     Chest wall: No tenderness.  Abdominal:   General: Bowel sounds are normal. There is no distension.     Palpations: Abdomen is soft. There is no mass.     Tenderness: There is no abdominal tenderness. There is no guarding or rebound.  Musculoskeletal: Normal range of motion.        General: No tenderness.  Lymphadenopathy:     Cervical: No cervical adenopathy.  Skin:    General: Skin is warm and dry.     Findings: No erythema or rash.  Neurological:     Mental Status: She is alert and oriented to person, place, and time.     Cranial Nerves: No cranial nerve deficit.     Motor: No abnormal muscle tone.     Coordination: Coordination normal.     Deep Tendon Reflexes: Reflexes are normal and symmetric. Reflexes normal.  Psychiatric:        Behavior: Behavior normal.        Thought Content: Thought content normal.        Judgment: Judgment normal.           Assessment & Plan:  Well exam. We discussed diet and exercise. Get fasting labs soon. We will set up another cervical spine MRI soon. I reminded her to see Dr. Tenny Crawoss soon.  Gershon CraneStephen Aryav Wimberly, MD

## 2018-07-25 ENCOUNTER — Other Ambulatory Visit (INDEPENDENT_AMBULATORY_CARE_PROVIDER_SITE_OTHER): Payer: 59

## 2018-07-25 DIAGNOSIS — Z0000 Encounter for general adult medical examination without abnormal findings: Secondary | ICD-10-CM | POA: Diagnosis not present

## 2018-07-25 LAB — POC URINALSYSI DIPSTICK (AUTOMATED)
BILIRUBIN UA: NEGATIVE
Glucose, UA: NEGATIVE
Ketones, UA: NEGATIVE
LEUKOCYTES UA: NEGATIVE
Nitrite, UA: NEGATIVE
PH UA: 6 (ref 5.0–8.0)
PROTEIN UA: POSITIVE — AB
Urobilinogen, UA: 0.2 E.U./dL

## 2018-07-25 LAB — BASIC METABOLIC PANEL
BUN: 9 mg/dL (ref 6–23)
CHLORIDE: 105 meq/L (ref 96–112)
CO2: 25 mEq/L (ref 19–32)
CREATININE: 0.65 mg/dL (ref 0.40–1.20)
Calcium: 8.9 mg/dL (ref 8.4–10.5)
GFR: 110.74 mL/min (ref >60.00)
Glucose, Bld: 85 mg/dL (ref 70–99)
POTASSIUM: 3.7 meq/L (ref 3.5–5.1)
Sodium: 139 mEq/L (ref 135–145)

## 2018-07-25 LAB — HEPATIC FUNCTION PANEL
ALBUMIN: 4.1 g/dL (ref 3.5–5.2)
ALT: 17 U/L (ref 0–35)
AST: 14 U/L (ref 0–37)
Alkaline Phosphatase: 77 U/L (ref 39–117)
BILIRUBIN DIRECT: 0.1 mg/dL (ref 0.0–0.3)
TOTAL PROTEIN: 6.8 g/dL (ref 6.0–8.3)
Total Bilirubin: 0.3 mg/dL (ref 0.2–1.2)

## 2018-07-25 LAB — CBC WITH DIFFERENTIAL/PLATELET
BASOS PCT: 0.7 % (ref 0.0–3.0)
Basophils Absolute: 0.1 10*3/uL (ref 0.0–0.1)
EOS PCT: 2.2 % (ref 0.0–5.0)
Eosinophils Absolute: 0.2 10*3/uL (ref 0.0–0.7)
HCT: 40.5 % (ref 36.0–46.0)
Hemoglobin: 13.8 g/dL (ref 12.0–15.0)
Lymphocytes Relative: 34.3 % (ref 12.0–46.0)
Lymphs Abs: 3.1 10*3/uL (ref 0.7–4.0)
MCHC: 34.1 g/dL (ref 30.0–36.0)
MCV: 88.3 fl (ref 78.0–100.0)
Monocytes Absolute: 0.7 10*3/uL (ref 0.1–1.0)
Monocytes Relative: 7.9 % (ref 3.0–12.0)
NEUTROS ABS: 5 10*3/uL (ref 1.4–7.7)
NEUTROS PCT: 54.9 % (ref 43.0–77.0)
PLATELETS: 336 10*3/uL (ref 150.0–400.0)
RBC: 4.58 Mil/uL (ref 3.87–5.11)
RDW: 12.4 % (ref 11.5–15.5)
WBC: 9.2 10*3/uL (ref 4.0–10.5)

## 2018-07-25 LAB — LIPID PANEL
CHOLESTEROL: 173 mg/dL (ref 0–200)
HDL: 42.7 mg/dL (ref >39.00)
LDL Cholesterol: 104 mg/dL — ABNORMAL HIGH (ref 0–99)
NonHDL: 130.65
TRIGLYCERIDES: 134 mg/dL (ref 0.0–149.0)
Total CHOL/HDL Ratio: 4
VLDL: 26.8 mg/dL (ref 0.0–40.0)

## 2018-07-25 LAB — TSH: TSH: 3.1 u[IU]/mL (ref 0.35–4.50)

## 2018-07-27 ENCOUNTER — Encounter: Payer: Self-pay | Admitting: *Deleted

## 2018-08-23 DIAGNOSIS — O3680X9 Pregnancy with inconclusive fetal viability, other fetus: Secondary | ICD-10-CM | POA: Diagnosis not present

## 2018-08-30 DIAGNOSIS — O3680X9 Pregnancy with inconclusive fetal viability, other fetus: Secondary | ICD-10-CM | POA: Diagnosis not present

## 2018-09-01 ENCOUNTER — Ambulatory Visit: Payer: 59 | Admitting: Cardiology

## 2018-09-04 DIAGNOSIS — N912 Amenorrhea, unspecified: Secondary | ICD-10-CM | POA: Diagnosis not present

## 2018-09-08 DIAGNOSIS — O26841 Uterine size-date discrepancy, first trimester: Secondary | ICD-10-CM | POA: Diagnosis not present

## 2018-09-08 DIAGNOSIS — Z3480 Encounter for supervision of other normal pregnancy, unspecified trimester: Secondary | ICD-10-CM | POA: Diagnosis not present

## 2018-10-10 ENCOUNTER — Telehealth: Payer: Self-pay | Admitting: Cardiology

## 2018-10-10 NOTE — Telephone Encounter (Signed)
    Pt contacted to discuss her upcoming appt. In light of the Covid 19 restrictions on human interaction. Brandi Wagner is doing well. She made this appointment to get checked regarding her small VSD now that she is pregnant. She says that she is not having any symptoms or issues and was told in the past by Dr. Tenny Craw that pregnancy should not be a problem. She has not been seen since 2018. Brandi Wagner is fine with putting her appointment off until later April. She will call us if she develops any problems prior to that and we can see her earlier. She verbalized understanding and was very pleasant about it.   I will send this to Clarene Duke for rescheduling.   Berton Bon, AGNP-C [facility] 10/10/2018  2:36 PM

## 2018-10-11 NOTE — Telephone Encounter (Signed)
Attempted to reach pt to reschedule her appointment but there was no answer. VM full so I could not leave a message

## 2018-10-13 DIAGNOSIS — Z369 Encounter for antenatal screening, unspecified: Secondary | ICD-10-CM | POA: Diagnosis not present

## 2018-10-13 DIAGNOSIS — Z3480 Encounter for supervision of other normal pregnancy, unspecified trimester: Secondary | ICD-10-CM | POA: Diagnosis not present

## 2018-10-13 LAB — OB RESULTS CONSOLE RUBELLA ANTIBODY, IGM: Rubella: IMMUNE

## 2018-10-13 LAB — OB RESULTS CONSOLE ANTIBODY SCREEN: Antibody Screen: NEGATIVE

## 2018-10-13 LAB — OB RESULTS CONSOLE ABO/RH: RH Type: POSITIVE

## 2018-10-13 LAB — OB RESULTS CONSOLE RPR: RPR: NONREACTIVE

## 2018-10-13 LAB — OB RESULTS CONSOLE GC/CHLAMYDIA
Chlamydia: NEGATIVE
Gonorrhea: NEGATIVE

## 2018-10-13 LAB — OB RESULTS CONSOLE HIV ANTIBODY (ROUTINE TESTING): HIV: NONREACTIVE

## 2018-10-13 LAB — OB RESULTS CONSOLE HEPATITIS B SURFACE ANTIGEN: Hepatitis B Surface Ag: NEGATIVE

## 2018-10-23 ENCOUNTER — Ambulatory Visit: Payer: 59 | Admitting: Cardiology

## 2018-11-07 DIAGNOSIS — Z3483 Encounter for supervision of other normal pregnancy, third trimester: Secondary | ICD-10-CM | POA: Diagnosis not present

## 2018-11-07 DIAGNOSIS — Z3482 Encounter for supervision of other normal pregnancy, second trimester: Secondary | ICD-10-CM | POA: Diagnosis not present

## 2018-11-13 ENCOUNTER — Encounter: Payer: Self-pay | Admitting: Cardiology

## 2018-11-13 ENCOUNTER — Other Ambulatory Visit: Payer: Self-pay

## 2018-11-13 ENCOUNTER — Telehealth (INDEPENDENT_AMBULATORY_CARE_PROVIDER_SITE_OTHER): Payer: Self-pay | Admitting: Cardiology

## 2018-11-13 VITALS — Ht 63.0 in | Wt 146.0 lb

## 2018-11-13 DIAGNOSIS — Z3490 Encounter for supervision of normal pregnancy, unspecified, unspecified trimester: Secondary | ICD-10-CM

## 2018-11-13 DIAGNOSIS — Q210 Ventricular septal defect: Secondary | ICD-10-CM

## 2018-11-13 NOTE — Patient Instructions (Signed)
Medication Instructions:  Your physician recommends that you continue on your current medications as directed. Please refer to the Current Medication list given to you today.  If you need a refill on your cardiac medications before your next appointment, please call your pharmacy.   Lab work: None ordered  If you have labs (blood work) drawn today and your tests are completely normal, you will receive your results only by: . MyChart Message (if you have MyChart) OR . A paper copy in the mail If you have any lab test that is abnormal or we need to change your treatment, we will call you to review the results.  Testing/Procedures: None ordered  Follow-Up: At [facility], you and your health needs are our priority.  As part of our continuing mission to provide you with exceptional heart care, we have created designated Provider Care Teams.  These Care Teams include your primary Cardiologist (physician) and Advanced Practice Providers (APPs -  Physician Assistants and Nurse Practitioners) who all work together to provide you with the care you need, when you need it. You will need a follow up appointment in:  12 months.  Please call our office 2 months in advance to schedule this appointment.  You may see Paula Ross, MD or one of the following Advanced Practice Providers on your designated Care Team: Scott Weaver, PA-C Vin Bhagat, PA-C . Janine Hammond, NP  Any Other Special Instructions Will Be Listed Below (If Applicable).    

## 2018-11-13 NOTE — Progress Notes (Signed)
Virtual Visit via Video Note   This visit type was conducted due to national recommendations for restrictions regarding the COVID-19 Pandemic (e.g. social distancing) in an effort to limit this patient's exposure and mitigate transmission in our community.  Due to her co-morbid illnesses, this patient is at least at moderate risk for complications without adequate follow up.  This format is felt to be most appropriate for this patient at this time.  All issues noted in this document were discussed and addressed.  A limited physical exam was performed with this format.  Please refer to the patient's chart for her consent to telehealth for [facility].   Evaluation Performed:  Follow-up visit  Date:  11/13/2018   ID:  Brandi Wagner, DOB 01/17/1986, MRN [id number]  Patient Location: Home Provider Location: Office  PCP:  Nelwyn SalisburyFry, Stephen A, MD  Cardiologist:  Dietrich PatesPaula Ross, MD  Electrophysiologist:  None   Chief Complaint:  VSD   History of Present Illness:    Brandi Wagner is a 33 y.o. female with history of VSD (small), When Dr. Tenny Crawoss saw her she had some dizziness, Seeing spots  BP was elevated at primary   HR was up here  recomm inderal 10mg  bid   Since seen she says she may feel a little better  Some blurriness L eye started yesterda    Dr. Tenny Crawoss saw the pt in Dec 2016 She was seen in ED for near syncope on Feb 28 2017  Working at time  Lightheaded  SOme chest tightness, Echo 06/25/15 with no aortic insufficiency VSD is small and normal LV and RV function with EF 55-60%   Pt has spells of dizziness, chest tightness, burning in back and numbness in back and both arms The day she had spell that sent her to ED it occurred about 11 AM   Had breakfast that day (chicken minis  [employer]  From [employer])  Says she drinks a lot of water during the day    SInce the day of ED visit she  has not had dizziness   Over past couple days still SOB  Not tight  Needs to take a big brathg to catch a  breath    Today she is doing well.  She is pregnant and in first trimester going into second.  She has no SOB, she did have some chest pain once while lying on lt side  She has had occ. Headache since finding out she was pregnant.  She is followed by Dr. Henderson CloudHorvath at [facility]  Her BP we could not take today but at last OV with Dr. Henderson CloudHorvath it was 120/70 per pt.   She is staying active, they are putting down floors at her home.  She has no palpitations.  She is staying in mostly and her husband does the shopping.  She does walk her dog outside.        The patient does not have symptoms concerning for COVID-19 infection (fever, chills, cough, or new shortness of breath).    Past Medical History:  Diagnosis Date  . Allergic rhinitis   . Eczema   . GERD (gastroesophageal reflux disease)   . Headache(784.0)   . Ventricular septal defect    sees Dr. Dietrich PatesPaula Ross    Past Surgical History:  Procedure Laterality Date  . CHOLECYSTECTOMY       Current Meds  Medication Sig  . Prenatal MV & Min w/FA-DHA (PRENATAL ADULT GUMMY/DHA/FA) 0.4-25 MG  CHEW Chew 2 capsules by mouth daily.     Allergies:   Promethazine; Promethazine hcl; and Other   Social History   Tobacco Use  . Smoking status: Never Smoker  . Smokeless tobacco: Never Used  Substance Use Topics  . Alcohol use: Yes    Alcohol/week: 2.0 standard drinks    Types: 2 Standard drinks or equivalent per week    Comment: weekends  . Drug use: No     Family Hx: The patient's family history includes Anuerysm in her mother; Hypertension in her mother and another family member.  ROS:   Please see the history of present illness.    General:no colds or fevers, no weight changes Skin:no rashes or ulcers HEENT:no blurred vision, no congestion CV:see HPI PUL:see HPI GI:no diarrhea constipation or melena, no indigestion GU:no hematuria, no dysuria MS:no joint pain, no claudication Neuro:no syncope, no lightheadedness Endo:no  diabetes, no thyroid disease  All other systems reviewed and are negative.   Prior CV studies:   The following studies were reviewed today:  Echo 06/25/15 Study Conclusions  - Left ventricle: 3 mm membranous VSD with left to right shunt. The   cavity size was mildly dilated. Systolic function was normal. The   estimated ejection fraction was in the range of 55% to 60%. Wall   motion was normal; there were no regional wall motion   abnormalities. - Atrial septum: No defect or patent foramen ovale was identified.  Labs/Other Tests and Data Reviewed:    EKG:  An ECG dated 03/01/17 was personally reviewed today and demonstrated:  SR with RBBB  Recent Labs: 07/25/2018: ALT 17; BUN 9; Creatinine, Ser 0.65; Hemoglobin 13.8; Platelets 336.0; Potassium 3.7; Sodium 139; TSH 3.10   Recent Lipid Panel Lab Results  Component Value Date/Time   CHOL 173 07/25/2018 07:53 AM   TRIG 134.0 07/25/2018 07:53 AM   HDL 42.70 07/25/2018 07:53 AM   CHOLHDL 4 07/25/2018 07:53 AM   LDLCALC 104 (H) 07/25/2018 07:53 AM    Wt Readings from Last 3 Encounters:  11/13/18 146 lb (66.2 kg)  07/21/18 149 lb 9 oz (67.8 kg)  02/20/18 148 lb 3.2 oz (67.2 kg)     Objective:    Vital Signs:  Ht  (1.6 m)   Wt 146 lb (66.2 kg)   LMP 07/26/2018 (Exact Date)   BMI 25.86 kg/m    VITAL SIGNS:  reviewed  Skin with acne but color pink Neuro alert and oriented X 3 MAE follows commands Lungs: can talk in complete sentences without SOB or wheezes Psych:  Affect happy with pregnancy    ASSESSMENT & PLAN:    1. VSD with palpitations no longer on inderal and no dizziness or palpitations.   2. Pregnancy will check with Dr. Tenny Craw if Echo needs to be done in Sept.  Due date in Oct.  Otherwise follow up in 1 year.   COVID-19 Education: The signs and symptoms of COVID-19 were discussed with the patient and how to seek care for testing (follow up with PCP or arrange E-visit).  The importance of social distancing  was discussed today.  Time:   Today, I have spent 10 minutes with the patient with telehealth technology discussing the above problems.     Medication Adjustments/Labs and Tests Ordered: Current medicines are reviewed at length with the patient today.  Concerns regarding medicines are outlined above.   Tests Ordered: No orders of the defined types were placed in this encounter.  Medication Changes: No orders of the defined types were placed in this encounter.   Disposition:  Follow up in 10 month(s)  Signed, Nada Boozer, NP  11/13/2018 1:36 PM    [facility]

## 2018-11-15 NOTE — Progress Notes (Signed)
No  Echo   Only if develops issues Make sure she has f/u with me after delivery

## 2018-11-27 ENCOUNTER — Telehealth: Payer: Self-pay | Admitting: Cardiology

## 2018-11-27 NOTE — Telephone Encounter (Signed)
Called pt to inform her of Nada Boozer, NP's message below.  Left a message for her to call back.

## 2018-11-27 NOTE — Telephone Encounter (Signed)
Reviewed with Dr. Tenny Craw possible need for Echo prior to delivery.  No need for echo unless she develops a problem, her obstetrician will direct her .  Other wise needs to see Dr. Tenny Craw a month after delivery.

## 2018-12-01 DIAGNOSIS — O09512 Supervision of elderly primigravida, second trimester: Secondary | ICD-10-CM | POA: Diagnosis not present

## 2018-12-01 DIAGNOSIS — Z3480 Encounter for supervision of other normal pregnancy, unspecified trimester: Secondary | ICD-10-CM | POA: Diagnosis not present

## 2018-12-01 NOTE — Telephone Encounter (Signed)
2nd attempt to reach pt re: message below.  Left another message for pt to call back. 

## 2018-12-29 DIAGNOSIS — Z369 Encounter for antenatal screening, unspecified: Secondary | ICD-10-CM | POA: Diagnosis not present

## 2019-01-25 DIAGNOSIS — Z369 Encounter for antenatal screening, unspecified: Secondary | ICD-10-CM | POA: Diagnosis not present

## 2019-01-25 DIAGNOSIS — Z3480 Encounter for supervision of other normal pregnancy, unspecified trimester: Secondary | ICD-10-CM | POA: Diagnosis not present

## 2019-02-01 DIAGNOSIS — O99810 Abnormal glucose complicating pregnancy: Secondary | ICD-10-CM | POA: Diagnosis not present

## 2019-02-08 ENCOUNTER — Other Ambulatory Visit: Payer: Self-pay

## 2019-02-08 ENCOUNTER — Encounter: Payer: Self-pay | Admitting: Registered"

## 2019-02-08 ENCOUNTER — Encounter: Payer: 59 | Attending: Obstetrics | Admitting: Registered"

## 2019-02-08 DIAGNOSIS — O99810 Abnormal glucose complicating pregnancy: Secondary | ICD-10-CM | POA: Insufficient documentation

## 2019-02-08 NOTE — Progress Notes (Signed)
Patient was seen on 02/08/2019 for Gestational Diabetes self-management education at the [facility]. The following learning objectives were met by the patient during this course:   States the definition of Gestational Diabetes  States why dietary management is important in controlling blood glucose  Describes the effects each nutrient has on blood glucose levels  Demonstrates ability to create a balanced meal plan  Demonstrates carbohydrate counting   States when to check blood glucose levels  Demonstrates proper blood glucose monitoring techniques  States the effect of stress and exercise on blood glucose levels  States the importance of limiting caffeine and abstaining from alcohol and smoking  Blood glucose monitor given: [employer] Lot # [id number] X Exp: 04/25/19 Blood glucose reading: 95 mg/dL  Patient instructed to monitor glucose levels: FBS: 60 - <95; 1 hour: <140; 2 hour: <120  Patient received handouts:  Nutrition Diabetes and Pregnancy, including carb counting list  Patient will be seen for follow-up as needed.

## 2019-02-22 DIAGNOSIS — Z362 Encounter for other antenatal screening follow-up: Secondary | ICD-10-CM | POA: Diagnosis not present

## 2019-02-22 DIAGNOSIS — Z23 Encounter for immunization: Secondary | ICD-10-CM | POA: Diagnosis not present

## 2019-02-22 DIAGNOSIS — O4453 Low lying placenta with hemorrhage, third trimester: Secondary | ICD-10-CM | POA: Diagnosis not present

## 2019-03-08 DIAGNOSIS — O403XX3 Polyhydramnios, third trimester, fetus 3: Secondary | ICD-10-CM | POA: Diagnosis not present

## 2019-03-14 DIAGNOSIS — O24419 Gestational diabetes mellitus in pregnancy, unspecified control: Secondary | ICD-10-CM | POA: Diagnosis not present

## 2019-03-19 DIAGNOSIS — O403XX3 Polyhydramnios, third trimester, fetus 3: Secondary | ICD-10-CM | POA: Diagnosis not present

## 2019-03-19 DIAGNOSIS — O24419 Gestational diabetes mellitus in pregnancy, unspecified control: Secondary | ICD-10-CM | POA: Diagnosis not present

## 2019-03-28 DIAGNOSIS — O403XX1 Polyhydramnios, third trimester, fetus 1: Secondary | ICD-10-CM | POA: Diagnosis not present

## 2019-04-06 DIAGNOSIS — O403XX9 Polyhydramnios, third trimester, other fetus: Secondary | ICD-10-CM | POA: Diagnosis not present

## 2019-04-06 DIAGNOSIS — O24419 Gestational diabetes mellitus in pregnancy, unspecified control: Secondary | ICD-10-CM | POA: Diagnosis not present

## 2019-04-06 DIAGNOSIS — Z3480 Encounter for supervision of other normal pregnancy, unspecified trimester: Secondary | ICD-10-CM | POA: Diagnosis not present

## 2019-04-06 LAB — OB RESULTS CONSOLE GBS: GBS: NEGATIVE

## 2019-04-13 DIAGNOSIS — O24419 Gestational diabetes mellitus in pregnancy, unspecified control: Secondary | ICD-10-CM | POA: Diagnosis not present

## 2019-04-18 ENCOUNTER — Telehealth (HOSPITAL_COMMUNITY): Payer: Self-pay | Admitting: *Deleted

## 2019-04-18 ENCOUNTER — Encounter (HOSPITAL_COMMUNITY): Payer: Self-pay | Admitting: *Deleted

## 2019-04-19 NOTE — Telephone Encounter (Signed)
Preadmission screen  

## 2019-04-20 ENCOUNTER — Encounter (HOSPITAL_COMMUNITY): Payer: Self-pay | Admitting: *Deleted

## 2019-04-20 ENCOUNTER — Telehealth (HOSPITAL_COMMUNITY): Payer: Self-pay | Admitting: *Deleted

## 2019-04-20 DIAGNOSIS — O24313 Unspecified pre-existing diabetes mellitus in pregnancy, third trimester: Secondary | ICD-10-CM | POA: Diagnosis not present

## 2019-04-20 NOTE — Telephone Encounter (Signed)
Preadmission screen  

## 2019-04-24 DIAGNOSIS — O24419 Gestational diabetes mellitus in pregnancy, unspecified control: Secondary | ICD-10-CM | POA: Diagnosis not present

## 2019-04-28 ENCOUNTER — Inpatient Hospital Stay (HOSPITAL_COMMUNITY): Payer: 59 | Admitting: Anesthesiology

## 2019-04-28 ENCOUNTER — Encounter (HOSPITAL_COMMUNITY): Payer: Self-pay

## 2019-04-28 ENCOUNTER — Other Ambulatory Visit: Payer: Self-pay

## 2019-04-28 ENCOUNTER — Other Ambulatory Visit (HOSPITAL_COMMUNITY): Admission: RE | Admit: 2019-04-28 | Payer: 59 | Source: Ambulatory Visit

## 2019-04-28 ENCOUNTER — Inpatient Hospital Stay (HOSPITAL_COMMUNITY)
Admission: AD | Admit: 2019-04-28 | Discharge: 2019-05-01 | DRG: 787 | Disposition: A | Discharge status: Home / Self Care | Payer: 59 | Attending: Obstetrics | Admitting: Obstetrics

## 2019-04-28 DIAGNOSIS — A6000 Herpesviral infection of urogenital system, unspecified: Secondary | ICD-10-CM | POA: Diagnosis present

## 2019-04-28 DIAGNOSIS — O134 Gestational [pregnancy-induced] hypertension without significant proteinuria, complicating childbirth: Secondary | ICD-10-CM | POA: Diagnosis not present

## 2019-04-28 DIAGNOSIS — R69 Illness, unspecified: Secondary | ICD-10-CM | POA: Diagnosis not present

## 2019-04-28 DIAGNOSIS — O9832 Other infections with a predominantly sexual mode of transmission complicating childbirth: Secondary | ICD-10-CM | POA: Diagnosis present

## 2019-04-28 DIAGNOSIS — O403XX0 Polyhydramnios, third trimester, not applicable or unspecified: Secondary | ICD-10-CM | POA: Diagnosis present

## 2019-04-28 DIAGNOSIS — O26893 Other specified pregnancy related conditions, third trimester: Secondary | ICD-10-CM | POA: Diagnosis present

## 2019-04-28 DIAGNOSIS — Q210 Ventricular septal defect: Secondary | ICD-10-CM | POA: Diagnosis not present

## 2019-04-28 DIAGNOSIS — Z3A39 39 weeks gestation of pregnancy: Secondary | ICD-10-CM | POA: Diagnosis not present

## 2019-04-28 DIAGNOSIS — O24420 Gestational diabetes mellitus in childbirth, diet controlled: Secondary | ICD-10-CM | POA: Diagnosis present

## 2019-04-28 DIAGNOSIS — O09513 Supervision of elderly primigravida, third trimester: Secondary | ICD-10-CM | POA: Diagnosis present

## 2019-04-28 DIAGNOSIS — Z20828 Contact with and (suspected) exposure to other viral communicable diseases: Secondary | ICD-10-CM | POA: Diagnosis present

## 2019-04-28 LAB — TYPE AND SCREEN
ABO/RH(D): O POS
Antibody Screen: NEGATIVE

## 2019-04-28 LAB — URINALYSIS, ROUTINE W REFLEX MICROSCOPIC
Bilirubin Urine: NEGATIVE
Glucose, UA: NEGATIVE mg/dL
Ketones, ur: NEGATIVE mg/dL
Leukocytes,Ua: NEGATIVE
Nitrite: NEGATIVE
Protein, ur: NEGATIVE mg/dL
Specific Gravity, Urine: 1.009 (ref 1.005–1.030)
pH: 7 (ref 5.0–8.0)

## 2019-04-28 LAB — GLUCOSE, CAPILLARY
Glucose-Capillary: 73 mg/dL (ref 70–99)
Glucose-Capillary: 75 mg/dL (ref 70–99)
Glucose-Capillary: 82 mg/dL (ref 70–99)
Glucose-Capillary: 91 mg/dL (ref 70–99)

## 2019-04-28 LAB — COMPREHENSIVE METABOLIC PANEL
ALT: 16 U/L (ref 0–44)
AST: 19 U/L (ref 15–41)
Albumin: 2.7 g/dL — ABNORMAL LOW (ref 3.5–5.0)
Alkaline Phosphatase: 141 U/L — ABNORMAL HIGH (ref 38–126)
Anion gap: 10 (ref 5–15)
BUN: 5 mg/dL — ABNORMAL LOW (ref 6–20)
CO2: 19 mmol/L — ABNORMAL LOW (ref 22–32)
Calcium: 8.5 mg/dL — ABNORMAL LOW (ref 8.9–10.3)
Chloride: 106 mmol/L (ref 98–111)
Creatinine, Ser: 0.61 mg/dL (ref 0.44–1.00)
GFR calc Af Amer: >60 mL/min (ref >60)
GFR calc non Af Amer: >60 mL/min (ref >60)
Glucose, Bld: 110 mg/dL — ABNORMAL HIGH (ref 70–99)
Potassium: 3.3 mmol/L — ABNORMAL LOW (ref 3.5–5.1)
Sodium: 135 mmol/L (ref 135–145)
Total Bilirubin: 0.1 mg/dL — ABNORMAL LOW (ref 0.3–1.2)
Total Protein: 6.4 g/dL — ABNORMAL LOW (ref 6.5–8.1)

## 2019-04-28 LAB — CBC
HCT: 35.7 % — ABNORMAL LOW (ref 36.0–46.0)
Hemoglobin: 11.9 g/dL — ABNORMAL LOW (ref 12.0–15.0)
MCH: 28.3 pg (ref 26.0–34.0)
MCHC: 33.3 g/dL (ref 30.0–36.0)
MCV: 85 fL (ref 80.0–100.0)
Platelets: 314 10*3/uL (ref 150–400)
RBC: 4.2 MIL/uL (ref 3.87–5.11)
RDW: 14 % (ref 11.5–15.5)
WBC: 11.8 10*3/uL — ABNORMAL HIGH (ref 4.0–10.5)
nRBC: 0 % (ref 0.0–0.2)

## 2019-04-28 LAB — POCT FERN TEST: POCT Fern Test: POSITIVE

## 2019-04-28 LAB — SARS CORONAVIRUS 2 BY RT PCR (HOSPITAL ORDER, PERFORMED IN ~~LOC~~ HOSPITAL LAB): SARS Coronavirus 2: NEGATIVE

## 2019-04-28 LAB — RPR: RPR Ser Ql: NONREACTIVE

## 2019-04-28 LAB — ABO/RH: ABO/RH(D): O POS

## 2019-04-28 MED ORDER — INFLUENZA VAC SPLIT QUAD 0.5 ML IM SUSY
0.5000 mL | PREFILLED_SYRINGE | INTRAMUSCULAR | Status: DC
  Filled 2019-04-28: qty 0.5

## 2019-04-28 MED ORDER — PHENYLEPHRINE 40 MCG/ML (10ML) SYRINGE FOR IV PUSH (FOR BLOOD PRESSURE SUPPORT): 80.0000 ug | PREFILLED_SYRINGE | INTRAVENOUS | Status: DC | PRN

## 2019-04-28 MED ORDER — LACTATED RINGERS IV SOLN: 500.0000 mL | Freq: Once | INTRAVENOUS | Status: DC

## 2019-04-28 MED ORDER — EPHEDRINE 5 MG/ML INJ: 10.0000 mg | INTRAVENOUS | Status: DC | PRN

## 2019-04-28 MED ORDER — SOD CITRATE-CITRIC ACID 500-334 MG/5ML PO SOLN
30.0000 mL | ORAL | Status: DC | PRN
  Administered 2019-04-29: 30 mL via ORAL
  Filled 2019-04-28: qty 30

## 2019-04-28 MED ORDER — TERBUTALINE SULFATE 1 MG/ML IJ SOLN: 0.2500 mg | Freq: Once | INTRAMUSCULAR | Status: DC | PRN

## 2019-04-28 MED ORDER — LACTATED RINGERS IV SOLN
500.0000 mL | INTRAVENOUS | Status: DC | PRN
  Administered 2019-04-28: 1000 mL via INTRAVENOUS

## 2019-04-28 MED ORDER — OXYCODONE-ACETAMINOPHEN 5-325 MG PO TABS: 2.0000 | ORAL_TABLET | ORAL | Status: DC | PRN

## 2019-04-28 MED ORDER — MISOPROSTOL 25 MCG QUARTER TABLET
25.0000 ug | ORAL_TABLET | Freq: Once | ORAL | Status: AC
  Administered 2019-04-28: 25 ug via BUCCAL
  Filled 2019-04-28: qty 1

## 2019-04-28 MED ORDER — PHENYLEPHRINE 40 MCG/ML (10ML) SYRINGE FOR IV PUSH (FOR BLOOD PRESSURE SUPPORT)
80.0000 ug | PREFILLED_SYRINGE | INTRAVENOUS | Status: DC | PRN
  Filled 2019-04-28: qty 10

## 2019-04-28 MED ORDER — LIDOCAINE HCL (PF) 1 % IJ SOLN: 30.0000 mL | INTRAMUSCULAR | Status: DC | PRN

## 2019-04-28 MED ORDER — ONDANSETRON HCL 4 MG/2ML IJ SOLN: 4.0000 mg | Freq: Four times a day (QID) | INTRAMUSCULAR | Status: DC | PRN

## 2019-04-28 MED ORDER — ACETAMINOPHEN 325 MG PO TABS: 650.0000 mg | ORAL_TABLET | ORAL | Status: DC | PRN

## 2019-04-28 MED ORDER — FENTANYL-BUPIVACAINE-NACL 0.5-0.125-0.9 MG/250ML-% EP SOLN
12.0000 mL/h | EPIDURAL | Status: DC | PRN
  Administered 2019-04-29: 12 mL/h via EPIDURAL
  Filled 2019-04-28 (×2): qty 250

## 2019-04-28 MED ORDER — OXYTOCIN BOLUS FROM INFUSION: 500.0000 mL | Freq: Once | INTRAVENOUS | Status: DC

## 2019-04-28 MED ORDER — OXYTOCIN 40 UNITS IN NORMAL SALINE INFUSION - SIMPLE MED
2.5000 [IU]/h | INTRAVENOUS | Status: DC
  Filled 2019-04-28: qty 1000

## 2019-04-28 MED ORDER — OXYTOCIN 40 UNITS IN NORMAL SALINE INFUSION - SIMPLE MED
1.0000 m[IU]/min | INTRAVENOUS | Status: DC
  Administered 2019-04-28: 2 m[IU]/min via INTRAVENOUS

## 2019-04-28 MED ORDER — DIPHENHYDRAMINE HCL 50 MG/ML IJ SOLN
12.5000 mg | INTRAMUSCULAR | Status: AC | PRN
  Administered 2019-04-28 – 2019-04-29 (×3): 12.5 mg via INTRAVENOUS
  Filled 2019-04-28 (×2): qty 1

## 2019-04-28 MED ORDER — LACTATED RINGERS IV SOLN
INTRAVENOUS | Status: DC
  Administered 2019-04-28 – 2019-04-29 (×5): via INTRAVENOUS

## 2019-04-28 MED ORDER — SODIUM CHLORIDE (PF) 0.9 % IJ SOLN
INTRAMUSCULAR | Status: DC | PRN
  Administered 2019-04-28: 12 mL/h via EPIDURAL

## 2019-04-28 MED ORDER — FENTANYL CITRATE (PF) 100 MCG/2ML IJ SOLN
50.0000 ug | INTRAMUSCULAR | Status: DC | PRN
  Administered 2019-04-28: 100 ug via INTRAVENOUS
  Filled 2019-04-28: qty 2

## 2019-04-28 MED ORDER — OXYCODONE-ACETAMINOPHEN 5-325 MG PO TABS: 1.0000 | ORAL_TABLET | ORAL | Status: DC | PRN

## 2019-04-28 MED ORDER — LIDOCAINE HCL (PF) 1 % IJ SOLN
INTRAMUSCULAR | Status: DC | PRN
  Administered 2019-04-28: 2 mL via EPIDURAL
  Administered 2019-04-28: 10 mL via EPIDURAL

## 2019-04-28 NOTE — H&P (Signed)
33 y.o. G1P0 @ [redacted]w[redacted]d presents with leakage of fluid at 0600.  On exam, she is grossly ruptured with a positive fern. Otherwise has good fetal movement and no bleeding.  Of note, she had several mildly elevated blood pressures in MAU.  Denies headache, visual changes, epigastric or abdominal pain.   Pregnancy c/b: 1. AMA (EDD 05/01/2019, patient DOB 05/02/2021).  2. GDMA1:  Growth [name] on 9/18: 6lb 10oz (42%) AFI 19,  3. VSD: Small 41mm membranous VSD.  Last ECHO 2016 normal. Followed by Dr. Harrington Challenger, cardiology, this pregnancy.  No recommendation for repeat ECHO during pregnancy as patient remained asymptomatic.  Fetus with normal fetal ECHO this prengancy 4. Genital HSV: on suppressive valtrex.  Denies lesions or prodromal symptoms.  5.  Mild polyhydramnios:  AFI as high as 30.  Most recent AFI 19  Past Medical History:  Diagnosis Date  . Allergic rhinitis   . Eczema   . GERD (gastroesophageal reflux disease)   . Gestational diabetes   . Headache(784.0)   . HSV (herpes simplex virus) anogenital infection   . Ventricular septal defect    sees Dr. Dorris Carnes     Past Surgical History:  Procedure Laterality Date  . CHOLECYSTECTOMY      OB History  Gravida Para Term Preterm AB Living  1            SAB TAB Ectopic Multiple Live Births               # Outcome Date GA Lbr Len/2nd Weight Sex Delivery Anes PTL Lv  1 Current             Social History   Socioeconomic History  . Marital status: Married    Spouse name: Not on file  . Number of children: Not on file  . Years of education: Not on file  . Highest education level: Not on file  Occupational History  . Not on file  Social Needs  . Financial resource strain: Not hard at all  . Food insecurity    Worry: Never true    Inability: Never true  . Transportation needs    Medical: No    Non-medical: No  Tobacco Use  . Smoking status: Never Smoker  . Smokeless tobacco: Never Used  Substance and Sexual Activity  . Alcohol use: Not  Currently    Alcohol/week: 2.0 standard drinks    Types: 2 Standard drinks or equivalent per week    Comment: weekends  . Drug use: No  . Sexual activity: Not on file   Promethazine, Promethazine hcl, and Other    Prenatal Transfer Tool  Maternal Diabetes: Yes:  Diabetes Type:  Diet controlled Genetic Screening: Normal Maternal Ultrasounds/Referrals: Isolated EIF (echogenic intracardiac focus) Fetal Ultrasounds or other Referrals:  Fetal echo Maternal Substance Abuse:  No Significant Maternal Medications:  Meds include: Other: valtrex Significant Maternal Lab Results: Group B Strep negative  ABO, Rh: O/Positive/-- (03/20 0000) Antibody: Negative (03/20 0000) Rubella: Immune (03/20 0000) RPR: Nonreactive (03/20 0000)  HBsAg: Negative (03/20 0000)  HIV: Non-reactive (03/20 0000)  GBS: Negative/-- (09/11 0000)       Vitals:   04/28/19 0738 04/28/19 0753  BP: (!) 147/96 (!) 149/102  Pulse: 94 96  Resp: 16   Temp: 98.3 F (36.8 C)   SpO2: 99%      General:  NAD Abdomen:  soft, gravid, EFW 7.5-8# Ex:  no edema Pelvic:  Vulva, vagina, cervix without lesions.  Exam limited by  large volume of amniotic fluid in posterior vagina SVE:  1/50/-2 per RN FHTs:  120s, moderate variability, category 1 Toco:  q2-6 minutes   A/P   33 y.o. G1P0 [redacted]w[redacted]d presents with rupture of membranes and likely gestational hypertension at term Admit to L&D Increasing contractions frequency--if no cervical change in 2 hours, will start cervical ripening Mild range BPs in MAU--will check cbc, monitor.  Is asymptomatic.   History of genital HSV--exam without evidence of outbreak, on anti-virals, and denies prodromal symptoms.  FSR/ vtx/ GBS neg  Graylen Noboa GEFFEL Hillel Card

## 2019-04-28 NOTE — Anesthesia Preprocedure Evaluation (Addendum)
Anesthesia Evaluation  Patient identified by MRN, date of birth, ID band Patient awake    Reviewed: Allergy & Precautions, NPO status , Patient's Chart, lab work & pertinent test results  Airway Mallampati: II  TM Distance: >3 FB Neck ROM: Full    Dental no notable dental hx.    Pulmonary neg pulmonary ROS,    Pulmonary exam normal breath sounds clear to auscultation       Cardiovascular Normal cardiovascular exam+ Valvular Problems/Murmurs  Rhythm:Regular Rate:Normal  VSD, follows with cardiology  Last echo 2016: - Left ventricle: 3 mm membranous VSD with left to right shunt. The   cavity size was mildly dilated. Systolic function was normal. The   estimated ejection fraction was in the range of 55% to 60%. Wall   motion was normal; there were no regional wall motion   abnormalities. - Atrial septum: No defect or patent foramen ovale was identified.   Neuro/Psych  Headaches, negative psych ROS   GI/Hepatic Neg liver ROS, GERD  ,  Endo/Other  negative endocrine ROSdiabetes  Renal/GU negative Renal ROS  negative genitourinary   Musculoskeletal negative musculoskeletal ROS (+)   Abdominal   Peds negative pediatric ROS (+)  Hematology negative hematology ROS (+)   Anesthesia Other Findings   Reproductive/Obstetrics (+) Pregnancy                             Anesthesia Physical Anesthesia Plan  ASA: II  Anesthesia Plan: Epidural   Post-op Pain Management:    Induction:   PONV Risk Score and Plan: 2  Airway Management Planned: Natural Airway  Additional Equipment: None  Intra-op Plan:   Post-operative Plan:   Informed Consent: I have reviewed the patients History and Physical, chart, labs and discussed the procedure including the risks, benefits and alternatives for the proposed anesthesia with the patient or authorized representative who has indicated his/her understanding  and acceptance.       Plan Discussed with:   Anesthesia Plan Comments: (For C/S with labor epidural)       Anesthesia Quick Evaluation

## 2019-04-28 NOTE — Anesthesia Procedure Notes (Addendum)
Epidural Patient location during procedure: OB Start time: 04/28/2019 10:52 PM End time: 04/28/2019 11:04 PM  Staffing Anesthesiologist: Pervis Hocking, DO Performed: anesthesiologist   Preanesthetic Checklist Completed: patient identified, pre-op evaluation, timeout performed, IV checked, risks and benefits discussed and monitors and equipment checked  Epidural Patient position: sitting Prep: site prepped and draped and DuraPrep Patient monitoring: continuous pulse ox, blood pressure, heart rate and cardiac monitor Approach: midline Location: L3-L4 Injection technique: LOR air  Needle:  Needle type: Tuohy  Needle gauge: 17 G Needle length: 9 cm Needle insertion depth: 4.5 cm Catheter type: closed end flexible Catheter size: 19 Gauge Catheter at skin depth: 10 cm Test dose: negative  Assessment Sensory level: T8 Events: blood not aspirated, injection not painful, no injection resistance, negative IV test and no paresthesia  Additional Notes Patient identified. Risks/Benefits/Options discussed with patient including but not limited to bleeding, infection, nerve damage, paralysis, failed block, incomplete pain control, headache, blood pressure changes, nausea, vomiting, reactions to medication both or allergic, itching and postpartum back pain. Confirmed with bedside nurse the patient's most recent platelet count. Confirmed with patient that they are not currently taking any anticoagulation, have any bleeding history or any family history of bleeding disorders. Patient expressed understanding and wished to proceed. All questions were answered. Sterile technique was used throughout the entire procedure. Please see nursing notes for vital signs. Test dose was given through epidural catheter and negative prior to continuing to dose epidural or start infusion. Warning signs of high block given to the patient including shortness of breath, tingling/numbness in hands, complete motor  block, or any concerning symptoms with instructions to call for help. Patient was given instructions on fall risk and not to get out of bed. All questions and concerns addressed with instructions to call with any issues or inadequate analgesia.  Reason for block:procedure for pain

## 2019-04-28 NOTE — MAU Note (Signed)
Brandi Wagner is a 33 y.o. at [redacted]w[redacted]d here in MAU reporting: states this AM after she used the bathroom and got back into bed and felt some trickling. States this happened around 54. States the fluid was clear. Had a little bit of spotting. Feeling some mild contractions. Was feeling good movement earlier but minimal since the LOF. States she is wearing a PP adult diaper.   Onset of complaint: 0550  Pain score: 3/10  Vitals:   04/28/19 0738  BP: (!) 147/96  Pulse: 94  Resp: 16  Temp: 98.3 F (36.8 C)  SpO2: 99%     FHT: 125  Lab orders placed from triage: none

## 2019-04-28 NOTE — Progress Notes (Signed)
Patient moved to room 201 due to temperature of room 204 too hot.

## 2019-04-28 NOTE — Plan of Care (Signed)

## 2019-04-28 NOTE — Progress Notes (Signed)
Comfortable with epidural  BP 131/81   Pulse 79   Temp 98.7 F (37.1 C) (Oral)   Resp 18   Ht 5\' 3"  (1.6 m)   Wt 73 kg   LMP 07/26/2018 (Exact Date)   SpO2 97%   BMI 28.52 kg/m   Toco: q3-4 minutes EFM: 120s, moderate variability, + scalp stimulation  A/P: G1 @ 39.3 with ROM Will start pitocin

## 2019-04-29 ENCOUNTER — Encounter (HOSPITAL_COMMUNITY)
Admission: AD | Disposition: A | Discharge status: Home / Self Care | Payer: Self-pay | Source: Home / Self Care | Attending: Obstetrics

## 2019-04-29 LAB — GLUCOSE, CAPILLARY
Glucose-Capillary: 88 mg/dL (ref 70–99)
Glucose-Capillary: 89 mg/dL (ref 70–99)
Glucose-Capillary: 82 mg/dL (ref 70–99)

## 2019-04-29 SURGERY — Surgical Case
Anesthesia: Epidural

## 2019-04-29 MED ORDER — DEXAMETHASONE SODIUM PHOSPHATE 10 MG/ML IJ SOLN: 8.0000 mg | Freq: Once | INTRAMUSCULAR | Status: DC | PRN

## 2019-04-29 MED ORDER — SODIUM CHLORIDE 0.9% FLUSH: 3.0000 mL | INTRAVENOUS | Status: DC | PRN

## 2019-04-29 MED ORDER — IBUPROFEN 800 MG PO TABS
800.0000 mg | ORAL_TABLET | Freq: Four times a day (QID) | ORAL | Status: DC
  Administered 2019-04-30 – 2019-05-01 (×3): 800 mg via ORAL
  Filled 2019-04-29 (×3): qty 1

## 2019-04-29 MED ORDER — MEPERIDINE HCL 25 MG/ML IJ SOLN: 6.2500 mg | INTRAMUSCULAR | Status: DC | PRN

## 2019-04-29 MED ORDER — CEFAZOLIN SODIUM-DEXTROSE 2-4 GM/100ML-% IV SOLN
2.0000 g | Freq: Once | INTRAVENOUS | Status: AC
  Administered 2019-04-29: 15:00:00 2 g via INTRAVENOUS

## 2019-04-29 MED ORDER — SCOPOLAMINE 1 MG/3DAYS TD PT72: 1.0000 | MEDICATED_PATCH | Freq: Once | TRANSDERMAL | Status: DC

## 2019-04-29 MED ORDER — SENNOSIDES-DOCUSATE SODIUM 8.6-50 MG PO TABS
2.0000 | ORAL_TABLET | ORAL | Status: DC
  Administered 2019-04-30: 2 via ORAL
  Filled 2019-04-29 (×2): qty 2

## 2019-04-29 MED ORDER — ONDANSETRON HCL 4 MG/2ML IJ SOLN
INTRAMUSCULAR | Status: AC
  Filled 2019-04-29: qty 2

## 2019-04-29 MED ORDER — ACETAMINOPHEN 500 MG PO TABS
1000.0000 mg | ORAL_TABLET | Freq: Four times a day (QID) | ORAL | Status: AC
  Administered 2019-04-29 – 2019-04-30 (×4): 1000 mg via ORAL
  Filled 2019-04-29 (×3): qty 2

## 2019-04-29 MED ORDER — KETOROLAC TROMETHAMINE 30 MG/ML IJ SOLN: 30.0000 mg | Freq: Four times a day (QID) | INTRAMUSCULAR | Status: DC | PRN

## 2019-04-29 MED ORDER — ONDANSETRON HCL 4 MG/2ML IJ SOLN
INTRAMUSCULAR | Status: DC | PRN
  Administered 2019-04-29: 4 mg via INTRAVENOUS

## 2019-04-29 MED ORDER — ACETAMINOPHEN 500 MG PO TABS: 1000.0000 mg | ORAL_TABLET | Freq: Four times a day (QID) | ORAL | Status: DC

## 2019-04-29 MED ORDER — ACETAMINOPHEN 500 MG PO TABS
ORAL_TABLET | ORAL | Status: AC
  Filled 2019-04-29: qty 2

## 2019-04-29 MED ORDER — DIPHENHYDRAMINE HCL 50 MG/ML IJ SOLN
12.5000 mg | INTRAMUSCULAR | Status: DC | PRN
  Administered 2019-04-29: 12.5 mg via INTRAVENOUS
  Filled 2019-04-29: qty 1

## 2019-04-29 MED ORDER — NALBUPHINE HCL 10 MG/ML IJ SOLN
5.0000 mg | Freq: Once | INTRAMUSCULAR | Status: DC | PRN
  Filled 2019-04-29: qty 0.5

## 2019-04-29 MED ORDER — LACTATED RINGERS IV SOLN
INTRAVENOUS | Status: DC
  Administered 2019-04-30: 03:00:00 via INTRAVENOUS

## 2019-04-29 MED ORDER — MEPERIDINE HCL 25 MG/ML IJ SOLN
INTRAMUSCULAR | Status: DC | PRN
  Administered 2019-04-29 (×2): 12.5 mg via INTRAVENOUS

## 2019-04-29 MED ORDER — ONDANSETRON HCL 4 MG/2ML IJ SOLN: 4.0000 mg | Freq: Three times a day (TID) | INTRAMUSCULAR | Status: DC | PRN

## 2019-04-29 MED ORDER — KETOROLAC TROMETHAMINE 30 MG/ML IJ SOLN: 30.0000 mg | Freq: Once | INTRAMUSCULAR | Status: DC | PRN

## 2019-04-29 MED ORDER — SIMETHICONE 80 MG PO CHEW: 80.0000 mg | CHEWABLE_TABLET | ORAL | Status: DC | PRN

## 2019-04-29 MED ORDER — ESMOLOL HCL 100 MG/10ML IV SOLN
INTRAVENOUS | Status: DC | PRN
  Administered 2019-04-29 (×2): 10 mg via INTRAVENOUS

## 2019-04-29 MED ORDER — OXYCODONE HCL 5 MG PO TABS
5.0000 mg | ORAL_TABLET | ORAL | Status: DC | PRN
  Administered 2019-04-30: 5 mg via ORAL
  Filled 2019-04-29: qty 1

## 2019-04-29 MED ORDER — NALBUPHINE HCL 10 MG/ML IJ SOLN
5.0000 mg | INTRAMUSCULAR | Status: DC | PRN
  Filled 2019-04-29: qty 0.5

## 2019-04-29 MED ORDER — SODIUM CHLORIDE 0.9 % IV SOLN
INTRAVENOUS | Status: DC | PRN
  Administered 2019-04-29: 16:00:00 via INTRAVENOUS

## 2019-04-29 MED ORDER — MENTHOL 3 MG MT LOZG: 1.0000 | LOZENGE | OROMUCOSAL | Status: DC | PRN

## 2019-04-29 MED ORDER — NALOXONE HCL 0.4 MG/ML IJ SOLN: 0.4000 mg | INTRAMUSCULAR | Status: DC | PRN

## 2019-04-29 MED ORDER — KETOROLAC TROMETHAMINE 30 MG/ML IJ SOLN
30.0000 mg | Freq: Four times a day (QID) | INTRAMUSCULAR | Status: AC
  Administered 2019-04-29 – 2019-04-30 (×4): 30 mg via INTRAVENOUS
  Filled 2019-04-29 (×3): qty 1

## 2019-04-29 MED ORDER — MORPHINE SULFATE (PF) 0.5 MG/ML IJ SOLN
INTRAMUSCULAR | Status: DC | PRN
  Administered 2019-04-29: 3 mg via EPIDURAL

## 2019-04-29 MED ORDER — MEPERIDINE HCL 25 MG/ML IJ SOLN
INTRAMUSCULAR | Status: AC
  Filled 2019-04-29: qty 1

## 2019-04-29 MED ORDER — LIDOCAINE-EPINEPHRINE (PF) 2 %-1:200000 IJ SOLN
INTRAMUSCULAR | Status: AC
  Filled 2019-04-29: qty 10

## 2019-04-29 MED ORDER — SODIUM CHLORIDE 0.9 % IV SOLN
INTRAVENOUS | Status: DC | PRN
  Administered 2019-04-29: 40 [IU] via INTRAVENOUS

## 2019-04-29 MED ORDER — KETOROLAC TROMETHAMINE 30 MG/ML IJ SOLN
INTRAMUSCULAR | Status: AC
  Filled 2019-04-29: qty 1

## 2019-04-29 MED ORDER — KETOROLAC TROMETHAMINE 30 MG/ML IJ SOLN
30.0000 mg | Freq: Four times a day (QID) | INTRAMUSCULAR | Status: DC | PRN
  Administered 2019-04-29: 30 mg via INTRAVENOUS
  Filled 2019-04-29: qty 1

## 2019-04-29 MED ORDER — CEFAZOLIN SODIUM-DEXTROSE 2-4 GM/100ML-% IV SOLN
INTRAVENOUS | Status: AC
  Filled 2019-04-29: qty 100

## 2019-04-29 MED ORDER — NALOXONE HCL 4 MG/10ML IJ SOLN
1.0000 ug/kg/h | INTRAVENOUS | Status: DC | PRN
  Filled 2019-04-29: qty 5

## 2019-04-29 MED ORDER — WITCH HAZEL-GLYCERIN EX PADS: 1.0000 "application " | MEDICATED_PAD | CUTANEOUS | Status: DC | PRN

## 2019-04-29 MED ORDER — SIMETHICONE 80 MG PO CHEW
80.0000 mg | CHEWABLE_TABLET | ORAL | Status: DC
  Administered 2019-04-30 (×2): 80 mg via ORAL
  Filled 2019-04-29 (×2): qty 1

## 2019-04-29 MED ORDER — LIDOCAINE-EPINEPHRINE (PF) 2 %-1:200000 IJ SOLN
INTRAMUSCULAR | Status: DC | PRN
  Administered 2019-04-29: 7 mL via EPIDURAL
  Administered 2019-04-29: 3 mL via EPIDURAL

## 2019-04-29 MED ORDER — DEXAMETHASONE SODIUM PHOSPHATE 4 MG/ML IJ SOLN
INTRAMUSCULAR | Status: AC
  Filled 2019-04-29: qty 1

## 2019-04-29 MED ORDER — DIPHENHYDRAMINE HCL 25 MG PO CAPS: 25.0000 mg | ORAL_CAPSULE | Freq: Four times a day (QID) | ORAL | Status: DC | PRN

## 2019-04-29 MED ORDER — OXYTOCIN 40 UNITS IN NORMAL SALINE INFUSION - SIMPLE MED: 2.5000 [IU]/h | INTRAVENOUS | Status: AC

## 2019-04-29 MED ORDER — OXYTOCIN 40 UNITS IN NORMAL SALINE INFUSION - SIMPLE MED
1.0000 m[IU]/min | INTRAVENOUS | Status: DC
  Administered 2019-04-29: 8 m[IU]/min via INTRAVENOUS

## 2019-04-29 MED ORDER — DIPHENHYDRAMINE HCL 25 MG PO CAPS: 25.0000 mg | ORAL_CAPSULE | ORAL | Status: DC | PRN

## 2019-04-29 MED ORDER — MORPHINE SULFATE (PF) 0.5 MG/ML IJ SOLN
INTRAMUSCULAR | Status: AC
  Filled 2019-04-29: qty 10

## 2019-04-29 MED ORDER — DIBUCAINE (PERIANAL) 1 % EX OINT: 1.0000 "application " | TOPICAL_OINTMENT | CUTANEOUS | Status: DC | PRN

## 2019-04-29 MED ORDER — OXYTOCIN 40 UNITS IN NORMAL SALINE INFUSION - SIMPLE MED: 1.0000 m[IU]/min | INTRAVENOUS | Status: DC

## 2019-04-29 MED ORDER — COCONUT OIL OIL: 1.0000 "application " | TOPICAL_OIL | Status: DC | PRN

## 2019-04-29 MED ORDER — HYDROMORPHONE HCL 1 MG/ML IJ SOLN: 0.2500 mg | INTRAMUSCULAR | Status: DC | PRN

## 2019-04-29 MED ORDER — SIMETHICONE 80 MG PO CHEW
80.0000 mg | CHEWABLE_TABLET | Freq: Three times a day (TID) | ORAL | Status: DC
  Administered 2019-04-30 (×3): 80 mg via ORAL
  Filled 2019-04-29 (×3): qty 1

## 2019-04-29 MED ORDER — OXYTOCIN 40 UNITS IN NORMAL SALINE INFUSION - SIMPLE MED
INTRAVENOUS | Status: AC
  Filled 2019-04-29: qty 1000

## 2019-04-29 MED ORDER — PRENATAL MULTIVITAMIN CH
1.0000 | ORAL_TABLET | Freq: Every day | ORAL | Status: DC
  Administered 2019-04-30: 1 via ORAL
  Filled 2019-04-29: qty 1

## 2019-04-29 MED ORDER — DEXAMETHASONE SODIUM PHOSPHATE 4 MG/ML IJ SOLN
INTRAMUSCULAR | Status: DC | PRN
  Administered 2019-04-29: 4 mg via INTRAVENOUS

## 2019-04-29 MED ORDER — DIPHENHYDRAMINE HCL 50 MG/ML IJ SOLN
12.5000 mg | Freq: Four times a day (QID) | INTRAMUSCULAR | Status: DC | PRN
  Administered 2019-04-29: 12.5 mg via INTRAVENOUS

## 2019-04-29 SURGICAL SUPPLY — 41 items
APL SKNCLS STERI-STRIP NONHPOA (GAUZE/BANDAGES/DRESSINGS) ×1
BENZOIN TINCTURE PRP APPL 2/3 (GAUZE/BANDAGES/DRESSINGS) ×2 IMPLANT
CHLORAPREP W/TINT 26ML (MISCELLANEOUS) ×2 IMPLANT
CLAMP CORD UMBIL (MISCELLANEOUS) IMPLANT
CLOTH BEACON ORANGE TIMEOUT ST (SAFETY) ×2 IMPLANT
CLSR STERI-STRIP ANTIMIC 1/2X4 (GAUZE/BANDAGES/DRESSINGS) ×1 IMPLANT
DRSG OPSITE POSTOP 4X10 (GAUZE/BANDAGES/DRESSINGS) ×2 IMPLANT
ELECT REM PT RETURN 9FT ADLT (ELECTROSURGICAL) ×2
ELECTRODE REM PT RTRN 9FT ADLT (ELECTROSURGICAL) ×1 IMPLANT
EXTRACTOR VACUUM KIWI (MISCELLANEOUS) IMPLANT
GLOVE BIOGEL PI IND STRL 6.5 (GLOVE) ×1 IMPLANT
GLOVE BIOGEL PI IND STRL 7.0 (GLOVE) ×1 IMPLANT
GLOVE BIOGEL PI INDICATOR 6.5 (GLOVE) ×1
GLOVE BIOGEL PI INDICATOR 7.0 (GLOVE) ×1
GLOVE ECLIPSE 6.0 STRL STRAW (GLOVE) ×2 IMPLANT
GOWN STRL REUS W/TWL LRG LVL3 (GOWN DISPOSABLE) ×4 IMPLANT
HEMOSTAT ARISTA ABSORB 3G PWDR (HEMOSTASIS) ×1 IMPLANT
KIT ABG SYR 3ML LUER SLIP (SYRINGE) IMPLANT
NDL HYPO 25X5/8 SAFETYGLIDE (NEEDLE) IMPLANT
NEEDLE HYPO 25X5/8 SAFETYGLIDE (NEEDLE) IMPLANT
NS IRRIG 1000ML POUR BTL (IV SOLUTION) ×2 IMPLANT
PACK C SECTION WH (CUSTOM PROCEDURE TRAY) ×2 IMPLANT
PAD OB MATERNITY 4.3X12.25 (PERSONAL CARE ITEMS) ×2 IMPLANT
PENCIL SMOKE EVAC W/HOLSTER (ELECTROSURGICAL) ×2 IMPLANT
RTRCTR C-SECT PINK 25CM LRG (MISCELLANEOUS) ×2 IMPLANT
SPONGE LAP 18X18 X RAY DECT (DISPOSABLE) ×1 IMPLANT
STRIP CLOSURE SKIN 1/2X4 (GAUZE/BANDAGES/DRESSINGS) ×2 IMPLANT
SUT MNCRL 0 VIOLET CTX 36 (SUTURE) ×2 IMPLANT
SUT MNCRL AB 3-0 PS2 27 (SUTURE) ×2 IMPLANT
SUT MON AB 4-0 PS1 27 (SUTURE) ×1 IMPLANT
SUT MONOCRYL 0 CTX 36 (SUTURE) ×2
SUT PLAIN 0 NONE (SUTURE) IMPLANT
SUT PLAIN 2 0 (SUTURE) ×2
SUT PLAIN ABS 2-0 CT1 27XMFL (SUTURE) ×1 IMPLANT
SUT VIC AB 0 CTX 36 (SUTURE) ×4
SUT VIC AB 0 CTX36XBRD ANBCTRL (SUTURE) ×2 IMPLANT
SUT VIC AB 2-0 CT1 27 (SUTURE) ×4
SUT VIC AB 2-0 CT1 TAPERPNT 27 (SUTURE) ×1 IMPLANT
TOWEL OR 17X24 6PK STRL BLUE (TOWEL DISPOSABLE) ×2 IMPLANT
TRAY FOLEY W/BAG SLVR 14FR LF (SET/KITS/TRAYS/PACK) ×2 IMPLANT
WATER STERILE IRR 1000ML POUR (IV SOLUTION) ×2 IMPLANT

## 2019-04-29 NOTE — Progress Notes (Signed)
Patient seen and examined.  MVUs mostly adequate since IUPC placed at 0815.  Cervix examined and again unchanged at 6/90/-2.  She has been unchanged now for 9 hours with adequate contractions.  We have tried a myriad of position changes throughout the day.  We discussed options and she wishes to proceed to the OR for cesarean section for arrest of dilation.  We discussed the risks to cesarean section to include infection, bleeding, damage to surrounding structures (including but not limited to bowel, bladder, tubes, ovaries, nerves, vessels, baby), need for blood transfusion, venous thromboembolism, need for additional procedures.   Ancef 2 gm IV on call to OR

## 2019-04-29 NOTE — Progress Notes (Signed)
Comfortable with epidural.   Blood pressure has been labile overnight, but no severe range BPs.  Vitals:   04/29/19 0500 04/29/19 0530 04/29/19 0555 04/29/19 0600  BP: (!) 145/100 119/75  128/81  Pulse: (!) 101 81  86  Resp: 18 18 18    Temp:   98.2 F (36.8 C)   TempSrc:   Oral   SpO2:   99% 97%  Weight:      Height:        Toco: q2-3 minutes EFM: 120s, moderate variability, + accelerations and scalp stimulation SVE: 6/90/-2  A/P: G1 @ [redacted]w[redacted]d w ROM Continue IOL w pitocin GHTN--BPs remain mild range without s/sx of severe disease Anticipate SVD

## 2019-04-29 NOTE — Transfer of Care (Signed)
Immediate Anesthesia Transfer of Care Note  Patient: Brandi Wagner  Procedure(s) Performed: CESAREAN SECTION (N/A )  Patient Location: PACU  Anesthesia Type:Epidural  Level of Consciousness: awake and alert   Airway & Oxygen Therapy: Patient Spontanous Breathing  Post-op Assessment: Report given to RN and Post -op Vital signs reviewed and stable  Post vital signs: Reviewed  Last Vitals:  Vitals Value Taken Time  BP 138/100 04/29/19 1700  Temp    Pulse 86 04/29/19 1701  Resp 15 04/29/19 1701  SpO2 100 % 04/29/19 1701  Vitals shown include unvalidated device data.  Last Pain:  Vitals:   04/29/19 1431  TempSrc:   PainSc: 0-No pain         Complications: No apparent anesthesia complications

## 2019-04-29 NOTE — Op Note (Signed)
Cesarean Section Procedure Note  Pre-operative Diagnosis: 1. Intrauterine pregnancy at [redacted]w[redacted]d  2. Gestational hypertension  3.  Gestational diabetes 4. Arrest of dilations  Post-operative Diagnosis: same as above  Surgeon: Marlow Baars, MD  Procedure: Primary low transverse cesarean section   Anesthesia: Epidural anesthesia  Estimated Blood Loss: 582 mL         Drains: Foley catheter         Specimens: placenta to L&D              Complications:  None; patient tolerated the procedure well.         Disposition: PACU - hemodynamically stable.  Findings:  Normal uterus, tubes and ovaries bilaterally.  Viable female infant, 3135g (6lb 14oz) Apgars 9, 9.    Procedure Details   After epidural anesthesia was found to adequate, the patient was placed in the dorsal supine position with a leftward tilt, prepped and draped in the usual sterile manner. A Pfannenstiel incision was made and carried down through the subcutaneous tissue to the fascia.  The fascia was incised in the midline and the fascial incision was extended laterally with Mayo scissors. The superior aspect of the fascial incision was grasped with two Kocher clamps, tented up and the rectus muscles dissected off sharply. The rectus was then dissected off with blunt dissection and Mayo scissors inferiorly. The rectus muscles were separated in the midline. The abdominal peritoneum was identified, tented up, entered bluntly, and the incision was extended superiorly and inferiorly with good visualization of the bladder. The Alexis retractor was deployed. The vesicouterine peritoneum was identified, tented up, entered sharply, and the bladder flap was created digitally. A scalpel was then used to make a low transverse incision on the uterus which was extended in the cephalad-caudad direction with blunt dissection. The fluid was clear. The fetal vertex was identified, elevated out of the pelvis and brought to the hysterotomy.  The head was  delivered easily followed by the shoulders and body.  After a 60 second delay per protocol, the cord was clamped and cut and the infant was passed to the waiting neonatologist.  The placenta was then delivered spontaneously, intact and appear normal, the uterus was cleared of all clot and debris   The hysterotomy was repaired with #0 Monocryl in running locked fashion.  A second imbricating layer of #0 Monocryl was placed.  There was a 4 x 2 cm subserosal hematoma extending from the mid-uterus to the left aspect.  This was monitored throughout the closure and noted to be stable and not expanding.  There was a small area of serosal bleeding superior to the hysterotomy.  A figure of eight suture of 4-0 monocryl was placed, but bleeding persisted.  Pressure was held, then Arista was placed over the serosal bleeder.  At this time, bleeding stopped.  The hematoma was examined again and stable.  The Alexis retractor was removed from the abdomen. The peritoneum was examined and all vessels noted to be hemostatic. The abdominal cavity was cleared of all clot and debris.  The peritoneum was closed with 2-0 vicryl in a running fashion.  The rectus muscles were then closed with 2-0 Vicryl. The fascia and rectus muscles were inspected and were hemostatic. The fascia was closed with 0 Vicryl in a running fashion. The subcutaneous layer was irrigated and all bleeders cauterized. The subcutaneous layer was closed with interrupted plain gut. The skin was closed with 3-0 monocryl in a subcuticular fashion. The incision was dressed with  benzoine, steri strips and honeycomb dressing. All sponge lap and needle counts were correct x3. Patient tolerated the procedure well and recovered in stable condition following the procedure.

## 2019-04-29 NOTE — Progress Notes (Signed)
Dr. Carlis Abbott notified of toradol duplicate orders. Updated on patients BP. Ordered to notify her if BP's remain in the 150's/ high 90-100's during the night. Will pass on to nightshift RN. Timoteo Ace, RN

## 2019-04-29 NOTE — Anesthesia Postprocedure Evaluation (Signed)
Anesthesia Post Note  Patient: Brandi Wagner  Procedure(s) Performed: CESAREAN SECTION (N/A )     Patient location during evaluation: PACU Anesthesia Type: Epidural Level of consciousness: awake Pain management: pain level controlled Vital Signs Assessment: post-procedure vital signs reviewed and stable Respiratory status: spontaneous breathing Cardiovascular status: stable Postop Assessment: no headache, no backache, epidural receding, patient able to bend at knees and no apparent nausea or vomiting Anesthetic complications: no    Last Vitals:  Vitals:   04/29/19 1747 04/29/19 1818  BP: 122/86 (!) 149/92  Pulse: 80 73  Resp: 17 16  Temp:  36.8 C  SpO2: 98% 98%    Last Pain:  Vitals:   04/29/19 1820  TempSrc:   PainSc: 4    Pain Goal:                Epidural/Spinal Function Cutaneous sensation: Tingles (04/29/19 1820), Patient able to flex knees: Yes (04/29/19 1820), Patient able to lift hips off bed: Yes (04/29/19 1820), Back pain beyond tenderness at insertion site: No (04/29/19 1820), Progressively worsening motor and/or sensory loss: No (04/29/19 1820), Bowel and/or bladder incontinence post epidural: No (04/29/19 1820)  Huston Foley

## 2019-04-29 NOTE — Progress Notes (Signed)
Cervix unchanged.  IUPC placed.  Will calculate MVUs and titrate pitocin as needed.

## 2019-04-30 ENCOUNTER — Encounter (HOSPITAL_COMMUNITY): Payer: Self-pay | Admitting: *Deleted

## 2019-04-30 ENCOUNTER — Inpatient Hospital Stay (HOSPITAL_COMMUNITY): Admission: AD | Admit: 2019-04-30 | Payer: 59 | Source: Home / Self Care | Admitting: Obstetrics

## 2019-04-30 ENCOUNTER — Inpatient Hospital Stay (HOSPITAL_COMMUNITY): Payer: 59

## 2019-04-30 LAB — CBC
HCT: 29.5 % — ABNORMAL LOW (ref 36.0–46.0)
Hemoglobin: 9.5 g/dL — ABNORMAL LOW (ref 12.0–15.0)
MCH: 27.5 pg (ref 26.0–34.0)
MCHC: 32.2 g/dL (ref 30.0–36.0)
MCV: 85.5 fL (ref 80.0–100.0)
Platelets: 253 10*3/uL (ref 150–400)
RBC: 3.45 MIL/uL — ABNORMAL LOW (ref 3.87–5.11)
RDW: 14.3 % (ref 11.5–15.5)
WBC: 18.6 10*3/uL — ABNORMAL HIGH (ref 4.0–10.5)
nRBC: 0 % (ref 0.0–0.2)

## 2019-04-30 NOTE — Lactation Note (Addendum)
This note was copied from a baby's chart. Lactation Consultation Note  Patient Name: Brandi Wagner Today's Date: 04/30/2019   P1, 20 hour female infant, weight loss -3%. LC entered room , mom  is in bed and dad is sitting in a chair  giving infant formula. Per mom, she has sore breast and she will be formula feeding only  Tonight, she doesn't want assistance with latching infant to breast at this time. She would prefer  to have [facility] services in the morning.   Maternal Data    Feeding    LATCH Score                   Interventions    Lactation Tools Discussed/Used     Consult Status      Vicente Serene 04/30/2019, 11:27 PM

## 2019-04-30 NOTE — Plan of Care (Signed)
  Problem: Elimination: Goal: Will not experience complications related to urinary retention Outcome: Progressing Note: Pt has voided 5 times since foley was removed, however, amts have not been adequate. Pt given pitcher of water & instructed to consume in 60mins.   Problem: Pain Managment: Goal: General experience of comfort will improve Outcome: Progressing Note: Pt has only required scheduled meds for pain control this shift.   Problem: Education: Goal: Knowledge of condition will improve Outcome: Progressing   Problem: Activity: Goal: Ability to tolerate increased activity will improve Outcome: Progressing Note: Pt ambulates in room without difficulty. RN has encouraged patient to ambulate in hallway, pt has not done this yet.   Problem: Life Cycle: Goal: Chance of risk for complications during the postpartum period will decrease Outcome: Progressing Note: Fundas firm, lochia WNL without clots.   Problem: Role Relationship: Goal: Ability to demonstrate positive interaction with newborn will improve Outcome: Progressing   Problem: Skin Integrity: Goal: Demonstration of wound healing without infection will improve Outcome: Progressing Note: Dressing was changed this shift per Dr Philis Pique order.

## 2019-04-30 NOTE — Progress Notes (Signed)
  Patient is eating, ambulating, voiding.  Pain control is good.  Vitals:   04/29/19 2040 04/29/19 2154 04/30/19 0120 04/30/19 0528  BP: (!) 142/98 (!) 147/97 139/88   Pulse: 73 76 70   Resp: 18 18 18 18   Temp: 97.7 F (36.5 C) 97.6 F (36.4 C) 97.9 F (36.6 C) 97.8 F (36.6 C)  TempSrc: Oral Axillary Oral Oral  SpO2: 98% 98% 98% 99%  Weight:      Height:        lungs:   clear to auscultation cor:    RRR Abdomen:  soft, appropriate tenderness, incisions intact and without erythema or exudate ex:    no cords   Lab Results  Component Value Date   WBC 18.6 (H) 04/30/2019   HGB 9.5 (L) 04/30/2019   HCT 29.5 (L) 04/30/2019   MCV 85.5 04/30/2019   PLT 253 04/30/2019    --/--/O POS, O POS Performed at [facility] [address], [place], [place] [address]  (10/03 0812)/[place]  A/P    Post operative day 1 GHTN with no severe range BPs.  Routine post op and postpartum care.  Expect d/c  tomorrow.  Percocet for pain control.

## 2019-04-30 NOTE — Lactation Note (Signed)
This note was copied from a baby's chart. Lactation Consultation Note Baby 1 hrs old. Mom stated has BF OK. Having some pain w/feeding, mom doesn't feel that baby was on deep enough. Repositioned mom and baby, giving support and position options. Assisted in football position. Mom's Lt. Nipple short shaft, shorter than Rt. W/sandwhich hold latched baby. Baby suckled well then pops off, then on. Baby suckling well hears occasional swallows. Hand expression taught w/colostrum noted. Shells given for mom to wear today after IV SL. Hand pump given to pre-pump before latching or use finger stimulation to evert more. Newborn feeding habits, STS, I&O, breast massage, supply and demand discussed. Questions answered. Encouraged to rest when baby rest. Stimulate baby to feed if hasn't cued in 3 hrs.  Mom encouraged to feed baby 8-12 times/24 hours and with feeding cues.  Encouraged mom to call for assistance. Lactation brochure given.  Patient Name: Brandi Wagner [id number] Date: 04/30/2019 Reason for consult: Initial assessment;Primapara;Term;Maternal endocrine disorder Type of Endocrine Disorder?: Diabetes   Maternal Data Has patient been taught Hand Expression?: Yes Does the patient have breastfeeding experience prior to this delivery?: No  Feeding Feeding Type: Breast Fed  LATCH Score Latch: Repeated attempts needed to sustain latch, nipple held in mouth throughout feeding, stimulation needed to elicit sucking reflex.  Audible Swallowing: A few with stimulation  Type of Nipple: Everted at rest and after stimulation(short shaft)  Comfort (Breast/Nipple): Soft / non-tender  Hold (Positioning): Assistance needed to correctly position infant at breast and maintain latch.  LATCH Score: 7  Interventions Interventions: Breast feeding basics reviewed;Adjust position;Assisted with latch;Support pillows;Skin to skin;Position options;Breast massage;Hand express;Pre-pump if  needed;Shells;Breast compression;Hand pump  Lactation Tools Discussed/Used Tools: Shells;Pump Shell Type: Inverted Breast pump type: Manual WIC Program: No Pump Review: Setup, frequency, and cleaning;Milk Storage Initiated by:: L.Dmitriy Gair RNIBCLC Date initiated:: 04/30/19   Consult Status Consult Status: Follow-up Date: 05/01/19 Follow-up type: In-patient    [name] 04/30/2019, 4:29 AM

## 2019-05-01 MED ORDER — OXYCODONE HCL 5 MG PO TABS: 5.0000 mg | ORAL_TABLET | ORAL | 0 refills | Status: DC | PRN

## 2019-05-01 NOTE — Discharge Summary (Signed)
Obstetric Discharge Summary Reason for Admission: onset of labor Prenatal Procedures: ultrasound Intrapartum Procedures: cesarean: low cervical, transverse Postpartum Procedures: none Complications-Operative and Postpartum: none Hemoglobin  Date Value Ref Range Status  04/30/2019 9.5 (L) 12.0 - 15.0 g/dL Final    Comment:    REPEATED TO VERIFY   HCT  Date Value Ref Range Status  04/30/2019 29.5 (L) 36.0 - 46.0 % Final    Physical Exam:  General: alert and cooperative Lochia: appropriate Uterine Fundus: firm Incision: healing well, no significant drainage DVT Evaluation: No evidence of DVT seen on physical exam.  Discharge Diagnoses: Term Pregnancy-delivered  Discharge Information: Date: 05/01/2019 Activity: pelvic rest Diet: routine Medications: PNV, Ibuprofen, Iron and Percocet Condition: stable Instructions: refer to practice specific booklet Discharge to: home Follow-up Information    Jerelyn Charles, MD Follow up in 4 week(s).   Specialty: Obstetrics Contact information: [address] [place] [address] [phone]           Newborn Data: Live born female  Birth Weight: 6 lb 14.6 oz (3135 g) APGAR: 53, 9  Newborn Delivery   Birth date/time: 04/29/2019 15:39:00 Delivery type: C-Section, Low Transverse Trial of labor: Yes C-section categorization: Primary      Home with mother.  Brandi Wagner 05/01/2019, 9:16 AM

## 2019-05-01 NOTE — Lactation Note (Signed)
This note was copied from a baby's chart. Lactation Consultation Note  Patient Name: Brandi Wagner [id number] Date: 05/01/2019   P1, Baby 13 hours old and mother states her whole body hurts so she is currently only formula feeding.  She states she will start pumping or try breastfeeding once home. Feed on demand with cues.  Goal 8-12+ times per day after first 24 hrs.  Place baby STS if not cueing.  Reviewed engorgement care and monitoring voids/stools. Mother has personal DEBP at home.      Maternal Data    Feeding Feeding Type: Bottle Fed - Formula  LATCH Score                   Interventions    Lactation Tools Discussed/Used     Consult Status      Carlye Grippe 05/01/2019, 9:23 AM

## 2019-05-02 ENCOUNTER — Encounter (HOSPITAL_COMMUNITY): Payer: Self-pay | Admitting: Obstetrics

## 2019-06-27 DIAGNOSIS — R69 Illness, unspecified: Secondary | ICD-10-CM | POA: Diagnosis not present

## 2019-08-17 ENCOUNTER — Encounter: Payer: Self-pay | Admitting: Family Medicine

## 2019-08-17 ENCOUNTER — Other Ambulatory Visit: Payer: Self-pay

## 2019-08-17 ENCOUNTER — Ambulatory Visit (INDEPENDENT_AMBULATORY_CARE_PROVIDER_SITE_OTHER): Payer: 59 | Admitting: Family Medicine

## 2019-08-17 VITALS — BP 120/70 | HR 109 | Temp 97.9°F | Wt 141.2 lb

## 2019-08-17 DIAGNOSIS — O99810 Abnormal glucose complicating pregnancy: Secondary | ICD-10-CM

## 2019-08-17 LAB — BASIC METABOLIC PANEL
BUN: 9 mg/dL (ref 6–23)
CO2: 22 mEq/L (ref 19–32)
Calcium: 9.2 mg/dL (ref 8.4–10.5)
Chloride: 105 mEq/L (ref 96–112)
Creatinine, Ser: 0.58 mg/dL (ref 0.40–1.20)
GFR: 118.1 mL/min (ref >60.00)
Glucose, Bld: 74 mg/dL (ref 70–99)
Potassium: 4 mEq/L (ref 3.5–5.1)
Sodium: 139 mEq/L (ref 135–145)

## 2019-08-17 LAB — HEMOGLOBIN A1C: Hgb A1c MFr Bld: 5.5 % (ref 4.6–6.5)

## 2019-08-17 NOTE — Progress Notes (Signed)
   Subjective:    Patient ID: Brandi Wagner, female    DOB: 16-Oct-1985, 34 y.o.   MRN: [id number]  HPI Here to follow up on an abnormal glucose tolerance test during her recent prgenancy. All her serum glucoses were normal, but she says one value of the GTT was a little high. She had a C section and this went well. She is not breast feeding. She feels great. No family hx of diabetes.    Review of Systems  Constitutional: Negative.   Respiratory: Negative.   Cardiovascular: Negative.   Gastrointestinal: Negative.   Endocrine: Negative.   Neurological: Negative.        Objective:   Physical Exam Constitutional:      Appearance: Normal appearance.  Cardiovascular:     Rate and Rhythm: Normal rate and regular rhythm.     Pulses: Normal pulses.     Heart sounds: Normal heart sounds.  Pulmonary:     Effort: Pulmonary effort is normal.     Breath sounds: Normal breath sounds.  Neurological:     Mental Status: She is alert.           Assessment & Plan:  Hx of abnormal GTT during pregnancy. We will get a BMET and a A1c today. I encouraged her to exercise regularly.  Gershon Crane, MD

## 2020-03-20 NOTE — Progress Notes (Signed)
Cardiology Office Note:    Date:  03/21/2020   ID:  Brandi Wagner, DOB [birth year], MRN [id number]  PCP:  Nelwyn Salisbury, MD  Cardiologist:  Dietrich Pates, MD   Electrophysiologist:  None   Referring MD: Nelwyn Salisbury, MD   Chief Complaint:  Follow-up (VSD)    Patient Profile:    Brandi Wagner is a 34 y.o. female with:   Small VSD  Palpitations; Rx with Propranolol in the past   Prior CV studies: Echocardiogram 06/25/15 3 mm membranous VSD w L-R shunt, mild LVE, EF 55-60, no AI, no RWMA  History of Present Illness:    Ms. Salsberry was last seen via Telemedicine in 10/2018.  She was pregnant and in her 1st trimester at that time.  She returns for follow up.  She is here alone.  She delivered her baby in 04/2019 (c-section).  She had no complications.  She was told she had a lot of PVCs in recovery.  She has not had chest pain, shortness of breath, syncope, leg swelling, orthopnea.  She has occasional palpitations.  She has been having fairly constant R arm pain that seems to radiate from her shoulder down her arm.  She has no assoc nausea, diaphoresis or shortness of breath.     Past Medical History:  Diagnosis Date  . Allergic rhinitis   . Eczema   . GERD (gastroesophageal reflux disease)   . Gestational diabetes   . Headache(784.0)   . HSV (herpes simplex virus) anogenital infection   . Ventricular septal defect    sees Dr. Dietrich Pates     Current Medications: Current Meds  Medication Sig  . loratadine (CLARITIN) 10 MG tablet Take 10 mg by mouth daily.  . Multiple Vitamins-Minerals (ONE-DAILY MULTI-VIT/MINERAL PO) Take 1 tablet by mouth daily.  . norgestimate-ethinyl estradiol (SPRINTEC 28) 0.25-35 MG-MCG tablet Sprintec (28) 0.25 mg-35 mcg tablet  TAKE 1 TABLET BY MOUTH EVERY DAY  . sertraline (ZOLOFT) 50 MG tablet sertraline 50 mg tablet  TAKE 1 & 1/2 TABLET BY MOUTH DAILY     Allergies:   Promethazine, Promethazine hcl, and Other   Social History   Tobacco Use    . Smoking status: Never Smoker  . Smokeless tobacco: Never Used  Vaping Use  . Vaping Use: Never used  Substance Use Topics  . Alcohol use: Not Currently    Alcohol/week: 2.0 standard drinks    Types: 2 Standard drinks or equivalent per week    Comment: weekends  . Drug use: No     Family Hx: The patient's family history includes Anuerysm in her mother; Hypertension in her mother and another family member.  ROS   EKGs/Labs/Other Test Reviewed:    EKG:  EKG is  ordered today.  The ekg ordered today demonstrates normal sinus rhythm, HR 83, normal axis, RBBB, freq monomorphic PVCs, QTc 479  Recent Labs: 04/28/2019: ALT 16 04/30/2019: Hemoglobin 9.5; Platelets 253 08/17/2019: BUN 9; Creatinine, Ser 0.58; Potassium 4.0; Sodium 139   Recent Lipid Panel Lab Results  Component Value Date/Time   CHOL 173 07/25/2018 07:53 AM   TRIG 134.0 07/25/2018 07:53 AM   HDL 42.70 07/25/2018 07:53 AM   CHOLHDL 4 07/25/2018 07:53 AM   LDLCALC 104 (H) 07/25/2018 07:53 AM    Physical Exam:    VS:  BP 120/90   Pulse 83   Ht 5\' 3"  (1.6 m)   Wt 137 lb (62.1 kg)   SpO2 98%   BMI  24.27 kg/m     Wt Readings from Last 3 Encounters:  03/21/20 137 lb (62.1 kg)  08/17/19 141 lb 3.2 oz (64 kg)  04/28/19 161 lb (73 kg)     Constitutional:      Appearance: Healthy appearance. Not in distress.  Neck:     Thyroid: No thyromegaly.     Vascular: JVD normal.  Pulmonary:     Effort: Pulmonary effort is normal.     Breath sounds: No wheezing. No rales.  Cardiovascular:     Normal rate. Regular rhythm. Normal S1. Normal S2.     Murmurs: There is a harsh holosystolic murmur at the URSB. The intensity does not change with Valsalva.  Edema:    Peripheral edema absent.  Abdominal:     Palpations: Abdomen is soft.  Skin:    General: Skin is warm and dry.  Neurological:     Mental Status: Alert and oriented to person, place and time.     Cranial Nerves: Cranial nerves are intact.        ASSESSMENT & PLAN:    1. VSD (ventricular septal defect) Asymptomatic.  It has been 5 years since her last echocardiogram.  I will arrange a follow up study.  -Schedule 2-D echocardiogram   2. PVC's (premature ventricular contractions) Fairly asymptomatic.  She is having a lot of them on her ECG today and they were noted in recovery after her c-section.  She does drink a lot of caffeine.    -Reduce caffeine  -Obtain echocardiogram as noted.  -72 hr Zio to assess PVC burden  -BMET, Mg 2+  3. Tobacco abuse I have recommended cessation.   4. Right arm pain Her symptoms sound MSK.  I have asked her to follow up with primary care for evluation.    5. Elevated BP Systolic is 90 today.  Her BP is usually optimal.  She had a salty meal earlier today.  I have asked her to monitor her BP at home, reduce caffeine.      Dispo:  Return in about 1 year (around 03/21/2021) for Routine Follow Up, w/ Dr. Tenny Craw, or Tereso Newcomer, PA-C, in person.   Medication Adjustments/Labs and Tests Ordered: Current medicines are reviewed at length with the patient today.  Concerns regarding medicines are outlined above.  Tests Ordered: Orders Placed This Encounter  Procedures  . Basic metabolic panel  . Magnesium  . LONG TERM MONITOR (3-14 DAYS)  . EKG 12-Lead  . ECHOCARDIOGRAM COMPLETE   Medication Changes: No orders of the defined types were placed in this encounter.   Signed, Tereso Newcomer, PA-C  03/21/2020 12:58 PM    [facility] [address] [place], [place], [place]  [address] Phone: [phone]; Fax: [phone]

## 2020-03-21 ENCOUNTER — Ambulatory Visit: Payer: 59 | Admitting: Physician Assistant

## 2020-03-21 ENCOUNTER — Telehealth: Payer: Self-pay | Admitting: Radiology

## 2020-03-21 ENCOUNTER — Other Ambulatory Visit: Payer: Self-pay

## 2020-03-21 ENCOUNTER — Encounter: Payer: Self-pay | Admitting: Physician Assistant

## 2020-03-21 VITALS — BP 120/90 | HR 83 | Ht 63.0 in | Wt 137.0 lb

## 2020-03-21 DIAGNOSIS — M79601 Pain in right arm: Secondary | ICD-10-CM

## 2020-03-21 DIAGNOSIS — I493 Ventricular premature depolarization: Secondary | ICD-10-CM | POA: Diagnosis not present

## 2020-03-21 DIAGNOSIS — R030 Elevated blood-pressure reading, without diagnosis of hypertension: Secondary | ICD-10-CM

## 2020-03-21 DIAGNOSIS — Q210 Ventricular septal defect: Secondary | ICD-10-CM

## 2020-03-21 DIAGNOSIS — Z720 Tobacco use: Secondary | ICD-10-CM | POA: Diagnosis not present

## 2020-03-21 NOTE — Patient Instructions (Addendum)
Medication Instructions:  Your physician recommends that you continue on your current medications as directed. Please refer to the Current Medication list given to you today.  *If you need a refill on your cardiac medications before your next appointment, please call your pharmacy*  Lab Work: You will have labs drawn today: BMET/Magnesium  Testing/Procedures: Your physician has requested that you have an echocardiogram. Echocardiography is a painless test that uses sound waves to create images of your heart. It provides your doctor with information about the size and shape of your heart and how well your heart's chambers and valves are working. This procedure takes approximately one hour. There are no restrictions for this procedure.  A zio monitor was ordered today. It will remain on for 3 days. You will then return monitor and event diary in provided box. It takes 1-2 weeks for report to be downloaded and returned to [name]. We will call you with the results. If monitor falls off or has orange flashing light, please call Zio for further instructions.   Follow-Up: At [facility], you and your health needs are our priority.  As part of our continuing mission to provide you with exceptional heart care, we have created designated Provider Care Teams.  These Care Teams include your primary Cardiologist (physician) and Advanced Practice Providers (APPs -  Physician Assistants and Nurse Practitioners) who all work together to provide you with the care you need, when you need it.  Your next appointment:   12 month(s)  The format for your next appointment:   In Person  Provider:   Dietrich Pates, MD

## 2020-03-21 NOTE — Telephone Encounter (Signed)
Enrolled patient for a 3 day Zio XT monitor to be mailed to patients home  

## 2020-03-22 LAB — BASIC METABOLIC PANEL
BUN/Creatinine Ratio: 14 (ref 9–23)
BUN: 9 mg/dL (ref 6–20)
CO2: 23 mmol/L (ref 20–29)
Calcium: 9.2 mg/dL (ref 8.7–10.2)
Chloride: 102 mmol/L (ref 96–106)
Creatinine, Ser: 0.65 mg/dL (ref 0.57–1.00)
GFR calc Af Amer: 133 mL/min/{1.73_m2} (ref >59)
GFR calc non Af Amer: 115 mL/min/{1.73_m2} (ref >59)
Glucose: 77 mg/dL (ref 65–99)
Potassium: 3.9 mmol/L (ref 3.5–5.2)
Sodium: 139 mmol/L (ref 134–144)

## 2020-03-22 LAB — MAGNESIUM: Magnesium: 2 mg/dL (ref 1.6–2.3)

## 2020-03-25 ENCOUNTER — Other Ambulatory Visit (INDEPENDENT_AMBULATORY_CARE_PROVIDER_SITE_OTHER): Payer: 59

## 2020-03-25 DIAGNOSIS — I493 Ventricular premature depolarization: Secondary | ICD-10-CM

## 2020-04-04 DIAGNOSIS — I493 Ventricular premature depolarization: Secondary | ICD-10-CM | POA: Diagnosis not present

## 2020-04-10 ENCOUNTER — Other Ambulatory Visit: Payer: Self-pay

## 2020-04-10 ENCOUNTER — Ambulatory Visit (HOSPITAL_COMMUNITY): Payer: 59 | Attending: Cardiology

## 2020-04-10 ENCOUNTER — Encounter: Payer: Self-pay | Admitting: Physician Assistant

## 2020-04-10 DIAGNOSIS — Q210 Ventricular septal defect: Secondary | ICD-10-CM

## 2020-04-10 DIAGNOSIS — I493 Ventricular premature depolarization: Secondary | ICD-10-CM | POA: Diagnosis not present

## 2020-04-10 LAB — ECHOCARDIOGRAM COMPLETE
Area-P 1/2: 3.97 cm2
S' Lateral: 3.1 cm

## 2020-04-11 ENCOUNTER — Telehealth: Payer: Self-pay | Admitting: *Deleted

## 2020-04-11 MED ORDER — METOPROLOL SUCCINATE ER 25 MG PO TB24: 25.0000 mg | ORAL_TABLET | Freq: Every day | ORAL | 2 refills | Status: DC

## 2020-04-11 NOTE — Telephone Encounter (Signed)
Left message to call office

## 2020-04-11 NOTE — Telephone Encounter (Signed)
-----   Message from Beatrice Lecher, [place] sent at 04/10/2020  2:01 PM EDT ----- Please call the patient.   She has some premature beats (PVCs) on her monitor.  These occur 6% of the time. So, there are not too many but she probably feels these.  She did not symptoms with PVCs at times.  She had one fast heartbeat that lasted just 8 beats.  This is fairly benign.  I think we should try to treat her with a beta-blocker.  This should help lessen the PVCs.  PLAN:  - start Metoprolol succinate 25 mg once daily  - send copy to PCP - FU with Dr. Tenny Craw or me in 3 mos.  Tereso Newcomer, PA-C    04/10/2020 1:51 PM

## 2020-04-11 NOTE — Telephone Encounter (Signed)
Patient notified.  She has also viewed comments in my chart.   Will send Metoprolol Succinate prescription to [facility] on [address] Appointment scheduled with Dr Tenny Craw for December 10.2021 at 3:20. Copy routed to Dr Clent Ridges

## 2020-04-14 ENCOUNTER — Telehealth: Payer: 59 | Admitting: Family Medicine

## 2020-04-14 DIAGNOSIS — A084 Viral intestinal infection, unspecified: Secondary | ICD-10-CM | POA: Diagnosis not present

## 2020-04-14 DIAGNOSIS — R109 Unspecified abdominal pain: Secondary | ICD-10-CM | POA: Diagnosis not present

## 2020-04-14 MED ORDER — ONDANSETRON 4 MG PO TBDP: 4.0000 mg | ORAL_TABLET | Freq: Three times a day (TID) | ORAL | 0 refills | Status: DC | PRN

## 2020-04-14 MED ORDER — DICYCLOMINE HCL 20 MG PO TABS: 20.0000 mg | ORAL_TABLET | Freq: Four times a day (QID) | ORAL | 0 refills | Status: DC

## 2020-04-14 NOTE — Progress Notes (Signed)
Patient ID: Brandi Wagner, female   DOB: 11/17/1985, 34 y.o.   MRN: [id number]   This visit type was conducted due to national recommendations for restrictions regarding the COVID-19 pandemic in an effort to limit this patient's exposure and mitigate transmission in our community.   Virtual Visit via Video Note  I connected with Darrel Hoover on 04/14/20 at  3:00 PM EDT by a video enabled telemedicine application and verified that I am speaking with the correct person using two identifiers.  Location patient: home Location provider:work or home office Persons participating in the virtual visit: patient, provider  I discussed the limitations of evaluation and management by telemedicine and the availability of in person appointments. The patient expressed understanding and agreed to proceed.   HPI: Brandi Wagner called with onset this past Saturday of some nausea without vomiting.  She felt somewhat sweaty and dizzy afterwards.  There was no syncope.  No fever.  She subsequently developed some watery diarrhea.  No bloody diarrhea.  She went for Covid testing and this came back negative.  No recent antibiotics.  No recent travels.  She had previous cholecystectomy.  She describes some diffuse bloated type pains throughout her abdomen.  No localizing pain.  Took some Pepcid and Pepto-Bismol without much improvement.  She still has some mild nausea but no vomiting.  Home pregnancy test negative. No dysuria.   ROS: See pertinent positives and negatives per HPI.  Past Medical History:  Diagnosis Date  . Allergic rhinitis   . Eczema   . GERD (gastroesophageal reflux disease)   . Gestational diabetes   . Headache(784.0)   . HSV (herpes simplex virus) anogenital infection   . PVC's (premature ventricular contractions)    3-day monitor 9/21: Sinus rhythm, average heart rate 84; PVCs (6.2%), one episode of SVT (short burst PAT) x8 beats  . Ventricular septal defect    sees Dr. Dietrich Pates     Past  Surgical History:  Procedure Laterality Date  . CESAREAN SECTION N/A 04/29/2019   Procedure: CESAREAN SECTION;  Surgeon: Marlow Baars, MD;  Location: [facility] LD ORS;  Service: Obstetrics;  Laterality: N/A;  . CHOLECYSTECTOMY      Family History  Problem Relation Age of Onset  . Hypertension Mother   . Anuerysm Mother        Brain  . Hypertension Other     SOCIAL HX: non-smoker   Current Outpatient Medications:  .  dicyclomine (BENTYL) 20 MG tablet, Take 1 tablet (20 mg total) by mouth every 6 (six) hours., Disp: 20 tablet, Rfl: 0 .  loratadine (CLARITIN) 10 MG tablet, Take 10 mg by mouth daily., Disp: , Rfl:  .  metoprolol succinate (TOPROL XL) 25 MG 24 hr tablet, Take 1 tablet (25 mg total) by mouth daily., Disp: 90 tablet, Rfl: 2 .  Multiple Vitamins-Minerals (ONE-DAILY MULTI-VIT/MINERAL PO), Take 1 tablet by mouth daily., Disp: , Rfl:  .  norgestimate-ethinyl estradiol (SPRINTEC 28) 0.25-35 MG-MCG tablet, Sprintec (28) 0.25 mg-35 mcg tablet  TAKE 1 TABLET BY MOUTH EVERY DAY, Disp: , Rfl:  .  ondansetron (ZOFRAN ODT) 4 MG disintegrating tablet, Take 1 tablet (4 mg total) by mouth every 8 (eight) hours as needed for nausea or vomiting., Disp: 12 tablet, Rfl: 0 .  sertraline (ZOLOFT) 50 MG tablet, sertraline 50 mg tablet  TAKE 1 & 1/2 TABLET BY MOUTH DAILY, Disp: , Rfl:   EXAM:  VITALS per patient if applicable:  GENERAL: alert, oriented, appears well and in no  acute distress  HEENT: atraumatic, conjunttiva clear, no obvious abnormalities on inspection of external nose and ears  NECK: normal movements of the head and neck  LUNGS: on inspection no signs of respiratory distress, breathing rate appears normal, no obvious gross SOB, gasping or wheezing  CV: no obvious cyanosis  MS: moves all visible extremities without noticeable abnormality  PSYCH/NEURO: pleasant and cooperative, no obvious depression or anxiety, speech and thought processing grossly intact  ASSESSMENT AND  PLAN:  Discussed the following assessment and plan:  2-day history of nausea without vomiting and diarrhea.  Covid test negative.  Suspect probably viral gastroenteritis.  Sounds like she is having some diffuse abdominal cramping.  -Continue clear liquids and bland diet as tolerated -Zofran 4 mg ODT 1 every 8 hours as needed for nausea and vomiting -Short-term trial of dicyclomine 20 mg 1 every 6-8 hours as needed for abdominal cramping -Follow-up immediately for any recurrent vomiting, fever, worsening abdominal pain, or localizing abdominal pain     I discussed the assessment and treatment plan with the patient. The patient was provided an opportunity to ask questions and all were answered. The patient agreed with the plan and demonstrated an understanding of the instructions.   The patient was advised to call back or seek an in-person evaluation if the symptoms worsen or if the condition fails to improve as anticipated.     Brandi Peat, MD

## 2020-04-15 ENCOUNTER — Encounter: Payer: Self-pay | Admitting: Physician Assistant

## 2020-05-08 DIAGNOSIS — Z20822 Contact with and (suspected) exposure to covid-19: Secondary | ICD-10-CM | POA: Diagnosis not present

## 2020-07-04 ENCOUNTER — Ambulatory Visit: Payer: 59 | Admitting: Internal Medicine

## 2020-08-15 ENCOUNTER — Ambulatory Visit: Payer: 59 | Admitting: Internal Medicine

## 2020-08-18 ENCOUNTER — Ambulatory Visit: Payer: No Typology Code available for payment source | Admitting: Internal Medicine

## 2020-09-08 ENCOUNTER — Ambulatory Visit: Payer: No Typology Code available for payment source | Admitting: Family Medicine

## 2020-09-08 DIAGNOSIS — Z0289 Encounter for other administrative examinations: Secondary | ICD-10-CM

## 2021-05-25 ENCOUNTER — Encounter: Payer: Self-pay | Admitting: Family Medicine

## 2021-05-25 ENCOUNTER — Telehealth: Payer: Self-pay

## 2021-05-25 NOTE — Telephone Encounter (Signed)
Dr. Fry's patient.     Please advise. 

## 2021-05-25 NOTE — Telephone Encounter (Signed)
Patient called stating that she is having an allergic action to yellow jacket stings and would like Rx sent to the pharmacy or recommendations on what to use.

## 2021-05-25 NOTE — Telephone Encounter (Signed)
Called patient left message, to contact office to give more information concerning message.

## 2021-05-25 NOTE — Telephone Encounter (Signed)
I do not have enough information to give any advice Please contact patient for entire full information. Cannot say if she needs a visit based on yinformation

## 2021-05-25 NOTE — Telephone Encounter (Signed)
Please give standard clinical advice for large local reaction to a bee sting.  Cool compresses topical steroids oral Benadryl every 6-8 hours Elevation follow-up visit in 2 to 3 days if getting worse instead of better.  Or if have fever.

## 2021-05-29 NOTE — Telephone Encounter (Signed)
Message complete.    See my chart message from patient on 05/25/21.

## 2021-06-15 ENCOUNTER — Ambulatory Visit (HOSPITAL_COMMUNITY): Admission: EM | Admit: 2021-06-15 | Payer: 59

## 2021-06-15 ENCOUNTER — Telehealth (INDEPENDENT_AMBULATORY_CARE_PROVIDER_SITE_OTHER): Payer: 59 | Admitting: Family Medicine

## 2021-06-15 ENCOUNTER — Other Ambulatory Visit: Payer: Self-pay

## 2021-06-15 ENCOUNTER — Ambulatory Visit
Admission: EM | Admit: 2021-06-15 | Discharge: 2021-06-15 | Disposition: A | Discharge status: Home / Self Care | Payer: 59 | Attending: Emergency Medicine | Admitting: Emergency Medicine

## 2021-06-15 DIAGNOSIS — R051 Acute cough: Secondary | ICD-10-CM

## 2021-06-15 DIAGNOSIS — R112 Nausea with vomiting, unspecified: Secondary | ICD-10-CM | POA: Diagnosis not present

## 2021-06-15 DIAGNOSIS — A084 Viral intestinal infection, unspecified: Secondary | ICD-10-CM

## 2021-06-15 DIAGNOSIS — R509 Fever, unspecified: Secondary | ICD-10-CM | POA: Diagnosis not present

## 2021-06-15 DIAGNOSIS — R197 Diarrhea, unspecified: Secondary | ICD-10-CM | POA: Diagnosis not present

## 2021-06-15 LAB — POCT INFLUENZA A/B
Influenza A, POC: NEGATIVE
Influenza B, POC: NEGATIVE

## 2021-06-15 MED ORDER — HYDROCODONE BIT-HOMATROP MBR 5-1.5 MG/5ML PO SOLN: 5.0000 mL | Freq: Three times a day (TID) | ORAL | 0 refills | Status: DC | PRN

## 2021-06-15 MED ORDER — ONDANSETRON 8 MG PO TBDP: 8.0000 mg | ORAL_TABLET | Freq: Three times a day (TID) | ORAL | 0 refills | Status: AC | PRN

## 2021-06-15 MED ORDER — LOPERAMIDE HCL 2 MG PO CAPS: ORAL_CAPSULE | ORAL | 0 refills | Status: DC

## 2021-06-15 NOTE — Discharge Instructions (Addendum)
Please see enclosed information regarding food choices to help relieve diarrhea.  I have renewed your prescription for Zofran that an 8 mg dose that is more effective than the 4 mg as you previously been prescribed.  I also recommend that you take loperamide as prescribed, if your insurance does not cover this asked the pharmacist to show you where it is on the shelf.  Please follow-up with primary care in the next 2 to 3 days if you have worsening symptoms or your symptoms have not resolved.  Your influenza test today was negative.

## 2021-06-15 NOTE — Progress Notes (Signed)
Patient ID: [name], female   DOB: Dec 06, 1985, 35 y.o.   MRN: [id number]   This visit type was conducted due to national recommendations for restrictions regarding the COVID-19 pandemic in an effort to limit this patient's exposure and mitigate transmission in our community.   Virtual Visit via Video Note  I connected with Brandi Wagner on 06/15/21 at  4:15 PM EST by a video enabled telemedicine application and verified that I am speaking with the correct person using two identifiers.  Location patient: home Location provider:work or home office Persons participating in the virtual visit: patient, provider  I discussed the limitations of evaluation and management by telemedicine and the availability of in person appointments. The patient expressed understanding and agreed to proceed.   HPI:  Brandi Wagner relates onset of illness this past weekend on Saturday.  Her daughter who is in preschool developed some nausea vomiting and diarrhea.  Brandi Wagner developed the same symptoms on Saturday.  She felt some better Sunday and ate a relatively normal meal and then had recurrence of symptoms.  She went to urgent care this morning and had influenza testing which was negative.  Question of norovirus.  She was prescribed Zofran.  She has kept down some fluids this afternoon.  She has another issue of 2 to 3 weeks of lingering cough.  No wheezing.  No dyspnea.  No fever.  Cough is dry.  She is taken some DayQuil and NyQuil with temporary relief though her cough at night is still fairly severe   ROS: See pertinent positives and negatives per HPI.  Past Medical History:  Diagnosis Date   Allergic rhinitis    Eczema    GERD (gastroesophageal reflux disease)    Gestational diabetes    Headache(784.0)    HSV (herpes simplex virus) anogenital infection    PVC's (premature ventricular contractions)    3-day monitor 9/21: Sinus rhythm, average heart rate 84; PVCs (6.2%), one episode of SVT (short burst PAT)  x8 beats   Ventricular septal defect    sees Dr. Dietrich Pates // Echo 9/21: EF 55-60, no RWMA, GR 1 DD, small perimembranous VSD with left-to-right flow, normal RVSF, RVSP 13.1, trivial MR, no AI     Past Surgical History:  Procedure Laterality Date   CESAREAN SECTION N/A 04/29/2019   Procedure: CESAREAN SECTION;  Surgeon: Marlow Baars, MD;  Location: [facility];  Service: Obstetrics;  Laterality: N/A;   CHOLECYSTECTOMY      Family History  Problem Relation Age of Onset   Hypertension Mother    Anuerysm Mother        Brain   Hypertension Other     SOCIAL HX: Non-smoker   Current Outpatient Medications:    HYDROcodone bit-homatropine (HYCODAN) 5-1.5 MG/5ML syrup, Take 5 mLs by mouth every 8 (eight) hours as needed for cough., Disp: 120 mL, Rfl: 0   loperamide (IMODIUM) 2 MG capsule, 4 mg (2 capsules) orally followed by 2 mg (1 capsule) after each unformed stool x 3 days; MAX 16 mg (8 capsules) per day, Disp: 24 capsule, Rfl: 0   loratadine (CLARITIN) 10 MG tablet, Take 10 mg by mouth daily., Disp: , Rfl:    Multiple Vitamins-Minerals (ONE-DAILY MULTI-VIT/MINERAL PO), Take 1 tablet by mouth daily., Disp: , Rfl:    ondansetron (ZOFRAN ODT) 8 MG disintegrating tablet, Take 1 tablet (8 mg total) by mouth every 8 (eight) hours as needed for up to 3 days for nausea or vomiting., Disp: 9 tablet, Rfl: 0  sertraline (ZOLOFT) 50 MG tablet, sertraline 50 mg tablet  TAKE 1 & 1/2 TABLET BY MOUTH DAILY, Disp: , Rfl:   EXAM:  VITALS per patient if applicable:  GENERAL: alert, oriented, appears well and in no acute distress  HEENT: atraumatic, conjunttiva clear, no obvious abnormalities on inspection of external nose and ears  NECK: normal movements of the head and neck  LUNGS: on inspection no signs of respiratory distress, breathing rate appears normal, no obvious gross SOB, gasping or wheezing  CV: no obvious cyanosis  MS: moves all visible extremities without noticeable  abnormality  PSYCH/NEURO: pleasant and cooperative, no obvious depression or anxiety, speech and thought processing grossly intact  ASSESSMENT AND PLAN:  Discussed the following assessment and plan:   Cough.  This has lingered for a few weeks since recent typical URI.  She does have some nausea, vomiting, and diarrhea which started couple days ago with influenza testing negative.  -Agreed to limited Hycodan cough syrup 1 teaspoon nightly for severe cough -Follow-up for any fever, dyspnea, or other concerns.  Also recommend follow-up with primary in 2 to 3 weeks if cough not fully resolving    I discussed the assessment and treatment plan with the patient. The patient was provided an opportunity to ask questions and all were answered. The patient agreed with the plan and demonstrated an understanding of the instructions.   The patient was advised to call back or seek an in-person evaluation if the symptoms worsen or if the condition fails to improve as anticipated.     [name], MD

## 2021-06-15 NOTE — ED Provider Notes (Signed)
[facility]    CSN: [id number] Arrival date & time: 06/15/21  1008   History   Chief Complaint No chief complaint on file.  HPI Brandi Wagner is a 35 y.o. female. Patient complains of a 3-day history of nausea, vomiting along with diarrhea.  Patient also states she has been having a fever.  Vital signs are normal on arrival today.  Patient states she ate Dione Plover 3 days ago, noticed that her 43-year-old daughter had a little bit of diarrhea the day before patient symptoms started.  Patient has a history of GI upset in the past, previously prescribed Bentyl.  Patient states that she did not eat much 2 days ago but yesterday began to feel better so she ate a full meal last night.  Patient states shortly thereafter she began vomiting and had diarrhea again.    The history is provided by the patient.   Past Medical History:  Diagnosis Date   Allergic rhinitis    Eczema    GERD (gastroesophageal reflux disease)    Gestational diabetes    Headache(784.0)    HSV (herpes simplex virus) anogenital infection    PVC's (premature ventricular contractions)    3-day monitor 9/21: Sinus rhythm, average heart rate 84; PVCs (6.2%), one episode of SVT (short burst PAT) x8 beats   Ventricular septal defect    sees Dr. Dietrich Pates // Echo 9/21: EF 55-60, no RWMA, GR 1 DD, small perimembranous VSD with left-to-right flow, normal RVSF, RVSP 13.1, trivial MR, no AI    Patient Active Problem List   Diagnosis Date Noted   PVC's (premature ventricular contractions)    Advanced maternal age, 1st pregnancy, third trimester 04/28/2019   Shingles 06/13/2015   ECZEMA 09/16/2009   VENTRICULAR SEPTAL DEFECT 09/16/2009   FATIGUE 09/16/2009   ALLERGIC RHINITIS 11/27/2007   GERD 05/09/2007   HEADACHE 05/09/2007   CARDIAC MURMUR, HX OF 05/09/2007   Past Surgical History:  Procedure Laterality Date   CESAREAN SECTION N/A 04/29/2019   Procedure: CESAREAN SECTION;  Surgeon: Marlow Baars, MD;   Location: [facility];  Service: Obstetrics;  Laterality: N/A;   CHOLECYSTECTOMY     OB History     Gravida  1   Para  1   Term  1   Preterm      AB      Living  1      SAB      IAB      Ectopic      Multiple  0   Live Births  1          Home Medications    Prior to Admission medications   Medication Sig Start Date End Date Taking? Authorizing Provider  loperamide (IMODIUM) 2 MG capsule 4 mg (2 capsules) orally followed by 2 mg (1 capsule) after each unformed stool x 3 days; MAX 16 mg (8 capsules) per day 06/15/21  Yes Theadora Rama Scales, PA-C  ondansetron (ZOFRAN ODT) 8 MG disintegrating tablet Take 1 tablet (8 mg total) by mouth every 8 (eight) hours as needed for up to 3 days for nausea or vomiting. 06/15/21 06/18/21 Yes Theadora Rama Scales, PA-C  loratadine (CLARITIN) 10 MG tablet Take 10 mg by mouth daily.    [provider]  metoprolol succinate (TOPROL XL) 25 MG 24 hr tablet Take 1 tablet (25 mg total) by mouth daily. 04/11/20   Tereso Newcomer T, PA-C  Multiple Vitamins-Minerals (ONE-DAILY MULTI-VIT/MINERAL PO) Take 1 tablet  by mouth daily.    [provider]  sertraline (ZOLOFT) 50 MG tablet sertraline 50 mg tablet  TAKE 1 & 1/2 TABLET BY MOUTH DAILY    [provider]   Family History Family History  Problem Relation Age of Onset   Hypertension Mother    Anuerysm Mother        Brain   Hypertension Other    Social History Social History   Tobacco Use   Smoking status: Never   Smokeless tobacco: Never  Vaping Use   Vaping Use: Never used  Substance Use Topics   Alcohol use: Not Currently    Alcohol/week: 2.0 standard drinks    Types: 2 Standard drinks or equivalent per week    Comment: weekends   Drug use: No   Allergies   Promethazine, Promethazine hcl, and Other  Review of Systems Review of Systems Pertinent findings noted in history of present illness.   Physical Exam Triage Vital Signs ED Triage  Vitals  Enc Vitals Group     BP 05/22/21 0827 (!) 147/82     Pulse Rate 05/22/21 0827 72     Resp 05/22/21 0827 18     Temp 05/22/21 0827 98.3 F (36.8 C)     Temp Source 05/22/21 0827 Oral     SpO2 05/22/21 0827 98 %     Weight --      Height --      Head Circumference --      Peak Flow --      Pain Score 05/22/21 0826 5     Pain Loc --      Pain Edu? --      Excl. in GC? --    No data found.  Updated Vital Signs BP 139/90 (BP Location: Right Arm)   Pulse 98   Temp 98.1 F (36.7 C) (Oral)   Resp 20   LMP 06/14/2021   SpO2 97%   Visual Acuity Right Eye Distance:   Left Eye Distance:   Bilateral Distance:    Right Eye Near:   Left Eye Near:    Bilateral Near:     Physical Exam Vitals and nursing note reviewed.  Constitutional:      General: She is not in acute distress.    Appearance: Normal appearance. She is not ill-appearing.  HENT:     Head: Normocephalic and atraumatic.  Eyes:     General: Lids are normal.        Right eye: No discharge.        Left eye: No discharge.     Extraocular Movements: Extraocular movements intact.     Conjunctiva/sclera: Conjunctivae normal.     Right eye: Right conjunctiva is not injected.     Left eye: Left conjunctiva is not injected.  Neck:     Trachea: Trachea and phonation normal.  Cardiovascular:     Rate and Rhythm: Normal rate and regular rhythm.     Pulses: Normal pulses.     Heart sounds: Normal heart sounds. No murmur heard.   No friction rub. No gallop.  Pulmonary:     Effort: Pulmonary effort is normal. No accessory muscle usage, prolonged expiration or respiratory distress.     Breath sounds: Normal breath sounds. No stridor, decreased air movement or transmitted upper airway sounds. No decreased breath sounds, wheezing, rhonchi or rales.  Chest:     Chest wall: No tenderness.  Abdominal:     General: Abdomen is flat. Bowel sounds  are normal. There is no distension.     Palpations: Abdomen is soft.      Tenderness: There is no abdominal tenderness. There is no right CVA tenderness or left CVA tenderness.     Hernia: No hernia is present.  Musculoskeletal:        General: Normal range of motion.     Cervical back: Normal range of motion and neck supple. Normal range of motion.  Lymphadenopathy:     Cervical: No cervical adenopathy.  Skin:    General: Skin is warm and dry.     Findings: No erythema or rash.  Neurological:     General: No focal deficit present.     Mental Status: She is alert and oriented to person, place, and time.  Psychiatric:        Mood and Affect: Mood normal.        Behavior: Behavior normal.   UC Treatments / Results  Labs (all labs ordered are listed, but only abnormal results are displayed)  Labs Reviewed  POCT INFLUENZA A/B    EKG  Radiology No results found.  Procedures Procedures (including critical care time)  Medications Ordered in UC Medications - No data to display  Initial Impression / Assessment and Plan / UC Course  I have reviewed the triage vital signs and the nursing notes.  Pertinent labs & imaging results that were available during my care of the patient were reviewed by me and considered in my medical decision making (see chart for details).      Patient reports overall feeling better today, vital signs are stable on arrival, patient is well-appearing per my observation.  Patient advised to continue bland diet for a full 24 hours after she is feeling better.  Food recommendations handout provided.  Patient advised to follow-up with her primary care provider in the next 2 to 3 days if she does not have full resolution of her symptoms.  Patient advised to return to urgent care or go to the emergency room for worsening symptoms.  Influenza test today is negative.  Patient appears to be suffering from viral gastroenteritis.   Final Clinical Impressions(s) / UC Diagnoses   Final diagnoses:  Nausea and vomiting, unspecified vomiting  type  Fever, unspecified  Viral gastroenteritis  Diarrhea, unspecified type     Discharge Instructions      Please see enclosed information regarding food choices to help relieve diarrhea.  I have renewed your prescription for Zofran that an 8 mg dose that is more effective than the 4 mg as you previously been prescribed.  I also recommend that you take loperamide as prescribed, if your insurance does not cover this asked the pharmacist to show you where it is on the shelf.  Please follow-up with primary care in the next 2 to 3 days if you have worsening symptoms or your symptoms have not resolved.  Your influenza test today was negative.     ED Prescriptions     Medication Sig Dispense Auth. Provider   ondansetron (ZOFRAN ODT) 8 MG disintegrating tablet Take 1 tablet (8 mg total) by mouth every 8 (eight) hours as needed for up to 3 days for nausea or vomiting. 9 tablet Theadora Rama Scales, PA-C   loperamide (IMODIUM) 2 MG capsule 4 mg (2 capsules) orally followed by 2 mg (1 capsule) after each unformed stool x 3 days; MAX 16 mg (8 capsules) per day 24 capsule Theadora Rama Scales, PA-C      PDMP  not reviewed this encounter.  Disposition Upon Discharge:  Patient presented with an acute illness with associated systemic symptoms and significant discomfort requiring urgent management. In my opinion, this is a condition that a prudent lay person (someone who possesses an average knowledge of health and medicine) may potentially expect to result in complications if not addressed urgently such as respiratory distress, impairment of bodily function or dysfunction of bodily organs.   Routine symptom specific, illness specific and/or disease specific instructions were discussed with the patient and/or caregiver at length.   As such, the patient has been evaluated and assessed, work-up was performed and treatment was provided in alignment with urgent care protocols and evidence based  medicine.  Patient/parent/caregiver has been advised that the patient may require follow up for further testing and treatment if the symptoms continue in spite of treatment, as clinically indicated and appropriate.  Patient was tested for COVID-19, Influenza and/or RSV, the patient/parent/guardian was advised to isolate at home pending the results of his/her diagnostic coronavirus test and potentially longer if they're positive. Advised pt that if his/her COVID-19 test returns positive, it's recommended to self-isolate for at least 10 days after symptoms first appeared AND until fever-free for 24 hours without fever reducer AND other symptoms have improved or resolved. Discussed self-isolation recommendations as well as instructions for household member/close contacts as per the [facility] and [place] [facility], and also gave patient the COVID packet with this information.  Patient/parent/caregiver has been advised to return to the [facility] or PCP in 3-5 days if no better; to PCP or the Emergency Department if new signs and symptoms develop, or if the current signs or symptoms continue to change or worsen for further workup, evaluation and treatment as clinically indicated and appropriate  The patient will follow up with their current PCP if and as advised. If the patient does not currently have a PCP we will assist them in obtaining one.   The patient may need specialty follow up if the symptoms continue, in spite of conservative treatment and management, for further workup, evaluation, consultation and treatment as clinically indicated and appropriate.  Patient/parent/caregiver verbalized understanding and agreement of plan as discussed.  All questions were addressed during visit.  Please see discharge instructions below for further details of plan.  Condition: stable for discharge home Home: take medications as prescribed; routine discharge instructions as discussed; follow up as advised.    Theadora Rama Scales,  PA-C 06/15/21 1150

## 2021-06-15 NOTE — ED Triage Notes (Signed)
Pt states 3 days ago she was nauseous, vomiting, had diarrhea and a fever.

## 2021-08-30 ENCOUNTER — Ambulatory Visit (HOSPITAL_COMMUNITY)
Admission: EM | Admit: 2021-08-30 | Discharge: 2021-08-30 | Disposition: A | Discharge status: Home / Self Care | Payer: 59 | Attending: Emergency Medicine | Admitting: Emergency Medicine

## 2021-08-30 ENCOUNTER — Other Ambulatory Visit: Payer: Self-pay

## 2021-08-30 ENCOUNTER — Encounter (HOSPITAL_COMMUNITY): Payer: Self-pay | Admitting: Emergency Medicine

## 2021-08-30 DIAGNOSIS — K2970 Gastritis, unspecified, without bleeding: Secondary | ICD-10-CM

## 2021-08-30 DIAGNOSIS — R1013 Epigastric pain: Secondary | ICD-10-CM

## 2021-08-30 DIAGNOSIS — R110 Nausea: Secondary | ICD-10-CM

## 2021-08-30 DIAGNOSIS — R197 Diarrhea, unspecified: Secondary | ICD-10-CM

## 2021-08-30 LAB — POCT URINALYSIS DIPSTICK, ED / UC
Glucose, UA: NEGATIVE mg/dL
Ketones, ur: 40 mg/dL — AB
Leukocytes,Ua: NEGATIVE
Nitrite: NEGATIVE
Protein, ur: 30 mg/dL — AB
Specific Gravity, Urine: >=1.03 (ref 1.005–1.030)
Urobilinogen, UA: 0.2 mg/dL (ref 0.0–1.0)
pH: 5.5 (ref 5.0–8.0)

## 2021-08-30 LAB — POC URINE PREG, ED: Preg Test, Ur: NEGATIVE

## 2021-08-30 MED ORDER — LANSOPRAZOLE 30 MG PO CPDR: 30.0000 mg | DELAYED_RELEASE_CAPSULE | Freq: Every day | ORAL | 2 refills | Status: DC

## 2021-08-30 MED ORDER — ONDANSETRON 8 MG PO TBDP: 8.0000 mg | ORAL_TABLET | Freq: Three times a day (TID) | ORAL | 0 refills | Status: DC | PRN

## 2021-08-30 NOTE — ED Provider Notes (Signed)
[facility]    CSN: [id number] Arrival date & time: 08/30/21  1446    HISTORY   Chief Complaint  Patient presents with   Nausea   Abdominal Pain   HPI Brandi Wagner is a 36 y.o. female. Yesterday had really bad indigestion and nausea. Reports today having abd pains today Taking lots Pepto, Pepcid complete, and Tums without relief.  Patient reports a history of gallbladder removal.  Patient states she usually follows a restricted diet but for the last few days has been less compliant.  Patient states her gallbladder was removed 17 years ago.  Patient states her husband also suffers from heartburn.  States his symptoms also wax and wane.   Past Medical History:  Diagnosis Date   Allergic rhinitis    Eczema    GERD (gastroesophageal reflux disease)    Gestational diabetes    Headache(784.0)    HSV (herpes simplex virus) anogenital infection    PVC's (premature ventricular contractions)    3-day monitor 9/21: Sinus rhythm, average heart rate 84; PVCs (6.2%), one episode of SVT (short burst PAT) x8 beats   Ventricular septal defect    sees Dr. Dietrich PatesPaula Ross // Echo 9/21: EF 55-60, no RWMA, GR 1 DD, small perimembranous VSD with left-to-right flow, normal RVSF, RVSP 13.1, trivial MR, no AI    Patient Active Problem List   Diagnosis Date Noted   PVC's (premature ventricular contractions)    Advanced maternal age, 1st pregnancy, third trimester 04/28/2019   Shingles 06/13/2015   ECZEMA 09/16/2009   VENTRICULAR SEPTAL DEFECT 09/16/2009   FATIGUE 09/16/2009   ALLERGIC RHINITIS 11/27/2007   GERD 05/09/2007   HEADACHE 05/09/2007   CARDIAC MURMUR, HX OF 05/09/2007   Past Surgical History:  Procedure Laterality Date   CESAREAN SECTION N/A 04/29/2019   Procedure: CESAREAN SECTION;  Surgeon: Marlow Baarslark, Dyanna, MD;  Location: [facility];  Service: Obstetrics;  Laterality: N/A;   CHOLECYSTECTOMY     OB History     Gravida  1   Para  1   Term  1   Preterm      AB       Living  1      SAB      IAB      Ectopic      Multiple  0   Live Births  1          Home Medications    Prior to Admission medications   Medication Sig Start Date End Date Taking? Authorizing Provider  HYDROcodone bit-homatropine (HYCODAN) 5-1.5 MG/5ML syrup Take 5 mLs by mouth every 8 (eight) hours as needed for cough. 06/15/21   Burchette, Elberta FortisBruce W, MD  loperamide (IMODIUM) 2 MG capsule 4 mg (2 capsules) orally followed by 2 mg (1 capsule) after each unformed stool x 3 days; MAX 16 mg (8 capsules) per day 06/15/21   Theadora RamaMorgan, Edwar Coe Scales, PA-C  loratadine (CLARITIN) 10 MG tablet Take 10 mg by mouth daily.    [provider]  Multiple Vitamins-Minerals (ONE-DAILY MULTI-VIT/MINERAL PO) Take 1 tablet by mouth daily.    [provider]  sertraline (ZOLOFT) 50 MG tablet sertraline 50 mg tablet  TAKE 1 & 1/2 TABLET BY MOUTH DAILY    [provider]    Family History Family History  Problem Relation Age of Onset   Hypertension Mother    Anuerysm Mother        Brain   Hypertension Other    Social  History Social History   Tobacco Use   Smoking status: Never   Smokeless tobacco: Never  Vaping Use   Vaping Use: Never used  Substance Use Topics   Alcohol use: Not Currently    Alcohol/week: 2.0 standard drinks    Types: 2 Standard drinks or equivalent per week    Comment: weekends   Drug use: No   Allergies   Promethazine, Promethazine hcl, and Other  Review of Systems Review of Systems Pertinent findings noted in history of present illness.   Physical Exam Triage Vital Signs ED Triage Vitals  Enc Vitals Group     BP 05/22/21 0827 (!) 147/82     Pulse Rate 05/22/21 0827 72     Resp 05/22/21 0827 18     Temp 05/22/21 0827 98.3 F (36.8 C)     Temp Source 05/22/21 0827 Oral     SpO2 05/22/21 0827 98 %     Weight --      Height --      Head Circumference --      Peak Flow --      Pain Score 05/22/21 0826 5     Pain Loc --       Pain Edu? --      Excl. in GC? --   No data found.  Updated Vital Signs BP (!) 137/104 (BP Location: Left Arm)    Pulse 88    Temp 98.2 F (36.8 C) (Oral)    Resp 16    LMP 08/28/2021    SpO2 97%   Physical Exam Vitals and nursing note reviewed.  Constitutional:      General: She is not in acute distress.    Appearance: Normal appearance. She is not ill-appearing.  HENT:     Head: Normocephalic and atraumatic.  Eyes:     General: Lids are normal.        Right eye: No discharge.        Left eye: No discharge.     Extraocular Movements: Extraocular movements intact.     Conjunctiva/sclera: Conjunctivae normal.     Right eye: Right conjunctiva is not injected.     Left eye: Left conjunctiva is not injected.  Neck:     Trachea: Trachea and phonation normal.  Cardiovascular:     Rate and Rhythm: Normal rate and regular rhythm.     Pulses: Normal pulses.     Heart sounds: Normal heart sounds. No murmur heard.   No friction rub. No gallop.  Pulmonary:     Effort: Pulmonary effort is normal. No accessory muscle usage, prolonged expiration or respiratory distress.     Breath sounds: Normal breath sounds. No stridor, decreased air movement or transmitted upper airway sounds. No decreased breath sounds, wheezing, rhonchi or rales.  Chest:     Chest wall: No tenderness.  Abdominal:     General: Abdomen is flat. Bowel sounds are normal. There is no distension.     Palpations: Abdomen is soft.     Tenderness: There is generalized abdominal tenderness. There is no right CVA tenderness or left CVA tenderness.     Hernia: No hernia is present.  Musculoskeletal:        General: Normal range of motion.     Cervical back: Normal range of motion and neck supple. Normal range of motion.  Lymphadenopathy:     Cervical: No cervical adenopathy.  Skin:    General: Skin is warm and dry.     Findings:  No erythema or rash.  Neurological:     General: No focal deficit present.     Mental Status:  She is alert and oriented to person, place, and time.  Psychiatric:        Mood and Affect: Mood normal.        Behavior: Behavior normal.    Visual Acuity Right Eye Distance:   Left Eye Distance:   Bilateral Distance:    Right Eye Near:   Left Eye Near:    Bilateral Near:     UC Couse / Diagnostics / Procedures:    EKG  Radiology No results found.  Procedures Procedures (including critical care time)  UC Diagnoses / Final Clinical Impressions(s)   I have reviewed the triage vital signs and the nursing notes.  Pertinent labs & imaging results that were available during my care of the patient were reviewed by me and considered in my medical decision making (see chart for details).    Final diagnoses:  Gastritis without bleeding, unspecified chronicity, unspecified gastritis type  Nausea  Epigastric pain  Diarrhea, unspecified type   Patient advised and concerned she may have an H. pylori infection or which we cannot test her in the urgent care.  Patient advised to follow-up with primary care to request a urea breath test.  I provided her with a prescription for Prevacid to take once her testing is complete.  I have renewed her prescription for Zofran.  Return precautions advised.  ED Prescriptions     Medication Sig Dispense Auth. Provider   ondansetron (ZOFRAN-ODT) 8 MG disintegrating tablet Take 1 tablet (8 mg total) by mouth every 8 (eight) hours as needed for nausea or vomiting. 20 tablet Theadora Rama Scales, PA-C   lansoprazole (PREVACID) 30 MG capsule Take 1 capsule (30 mg total) by mouth daily at 12 noon. 30 capsule Theadora Rama Scales, PA-C      PDMP not reviewed this encounter.  Pending results:  Labs Reviewed  POCT URINALYSIS DIPSTICK, ED / UC - Abnormal; Notable for the following components:      Result Value   Bilirubin Urine SMALL (*)    Ketones, ur 40 (*)    Hgb urine dipstick SMALL (*)    Protein, ur 30 (*)    All other components within  normal limits  POC URINE PREG, ED    Medications Ordered in UC: Medications - No data to display  Disposition Upon Discharge:  Condition: stable for discharge home Home: take medications as prescribed; routine discharge instructions as discussed; follow up as advised.  Patient presented with an acute illness with associated systemic symptoms and significant discomfort requiring urgent management. In my opinion, this is a condition that a prudent lay person (someone who possesses an average knowledge of health and medicine) may potentially expect to result in complications if not addressed urgently such as respiratory distress, impairment of bodily function or dysfunction of bodily organs.   Routine symptom specific, illness specific and/or disease specific instructions were discussed with the patient and/or caregiver at length.   As such, the patient has been evaluated and assessed, work-up was performed and treatment was provided in alignment with urgent care protocols and evidence based medicine.  Patient/parent/caregiver has been advised that the patient may require follow up for further testing and treatment if the symptoms continue in spite of treatment, as clinically indicated and appropriate.  If the patient was tested for COVID-19, Influenza and/or RSV, then the patient/parent/guardian was advised to isolate at home  pending the results of his/her diagnostic coronavirus test and potentially longer if theyre positive. I have also advised pt that if his/her COVID-19 test returns positive, it's recommended to self-isolate for at least 10 days after symptoms first appeared AND until fever-free for 24 hours without fever reducer AND other symptoms have improved or resolved. Discussed self-isolation recommendations as well as instructions for household member/close contacts as per the [facility] and [place] [facility], and also gave patient the COVID packet with this information.  Patient/parent/caregiver has  been advised to return to the [facility] or PCP in 3-5 days if no better; to PCP or the Emergency Department if new signs and symptoms develop, or if the current signs or symptoms continue to change or worsen for further workup, evaluation and treatment as clinically indicated and appropriate  The patient will follow up with their current PCP if and as advised. If the patient does not currently have a PCP we will assist them in obtaining one.   The patient may need specialty follow up if the symptoms continue, in spite of conservative treatment and management, for further workup, evaluation, consultation and treatment as clinically indicated and appropriate.   Patient/parent/caregiver verbalized understanding and agreement of plan as discussed.  All questions were addressed during visit.  Please see discharge instructions below for further details of plan.  Discharge Instructions:   Discharge Instructions      Please read the enclosed information about H. pylori infection and for choices for gastroesophageal reflux disease.  I believe that you may be suffering from intermittent episodes which may be related to possible H. pylori infection or may be related to history of gallbladder removal and occasional "less than ideal" food choices.  (I also included a refresher about foods to eat when you do not have a gallbladder)  H. pylori can cause nausea, vomiting, blood in your stool, dizziness, fever, body aches, chills, diarrhea and gastric ulcers.  If untreated, the disease can become very serious and can cause perforating ulcers which can be life-threatening.  I recommend the H. pylori breath test for definitive rule in/rule out of H. pylori infection.  Unfortunately, we cannot perform this test at urgent care.  Please reach out to your primary care provider to have this done as soon as possible.    After the breath test is performed.  I recommend that you begin taking Prevacid 30 mg daily to suppress  acid production and reduce the occurrence and severity of gastritis symptoms.  I have sent a prescription to your pharmacy.  I have also renewed your prescription for Zofran which you can fill if you find that this medication has previously been helpful with your symptoms of nausea.  Your urine pregnancy test today was negative.  There was a small amount of blood in your urine today, we will perform a urine culture to be complete and if there is a positive result in your urine culture, you will be contacted by phone and antibiotics we prescribed for you.  Urine cultures typically take 3 to 5 days so I would expect a phone call early next week.  Thank you for visiting urgent care today.  It was a pleasure to see you again.  I appreciate the opportunity to participate in your care.    This office note has been dictated using Teaching laboratory technician.  Unfortunately, and despite my best efforts, this method of dictation can sometimes lead to occasional typographical or grammatical errors.  I apologize in advance if  this occurs.     Theadora RamaMorgan, Triana Coover Scales, [place]-C 08/31/21 [phone]

## 2021-08-30 NOTE — Discharge Instructions (Addendum)
Please read the enclosed information about H. pylori infection and for choices for gastroesophageal reflux disease.  I believe that you may be suffering from intermittent episodes which may be related to possible H. pylori infection or may be related to history of gallbladder removal and occasional "less than ideal" food choices.  (I also included a refresher about foods to eat when you do not have a gallbladder)  H. pylori can cause nausea, vomiting, blood in your stool, dizziness, fever, body aches, chills, diarrhea and gastric ulcers.  If untreated, the disease can become very serious and can cause perforating ulcers which can be life-threatening.  I recommend the H. pylori breath test for definitive rule in/rule out of H. pylori infection.  Unfortunately, we cannot perform this test at urgent care.  Please reach out to your primary care provider to have this done as soon as possible.    After the breath test is performed.  I recommend that you begin taking Prevacid 30 mg daily to suppress acid production and reduce the occurrence and severity of gastritis symptoms.  I have sent a prescription to your pharmacy.  I have also renewed your prescription for Zofran which you can fill if you find that this medication has previously been helpful with your symptoms of nausea.  Your urine pregnancy test today was negative.  There was a small amount of blood in your urine today, we will perform a urine culture to be complete and if there is a positive result in your urine culture, you will be contacted by phone and antibiotics we prescribed for you.  Urine cultures typically take 3 to 5 days so I would expect a phone call early next week.  Thank you for visiting urgent care today.  It was a pleasure to see you again.  I appreciate the opportunity to participate in your care.

## 2021-08-30 NOTE — ED Triage Notes (Signed)
Yesterday had really bad indigestion and nausea. Reports today having abd pains today Taking lots Pepto, Pepcid complete, and Tums without relief.

## 2021-12-02 ENCOUNTER — Ambulatory Visit: Payer: No Typology Code available for payment source | Admitting: Family Medicine

## 2021-12-07 ENCOUNTER — Encounter: Payer: No Typology Code available for payment source | Admitting: Family Medicine

## 2021-12-07 ENCOUNTER — Other Ambulatory Visit (INDEPENDENT_AMBULATORY_CARE_PROVIDER_SITE_OTHER): Payer: No Typology Code available for payment source

## 2021-12-07 DIAGNOSIS — E785 Hyperlipidemia, unspecified: Secondary | ICD-10-CM | POA: Diagnosis not present

## 2021-12-07 DIAGNOSIS — R739 Hyperglycemia, unspecified: Secondary | ICD-10-CM | POA: Diagnosis not present

## 2021-12-07 LAB — CBC WITH DIFFERENTIAL/PLATELET
Basophils Absolute: 0.1 10*3/uL (ref 0.0–0.1)
Basophils Relative: 0.9 % (ref 0.0–3.0)
Eosinophils Absolute: 0.2 10*3/uL (ref 0.0–0.7)
Eosinophils Relative: 2.1 % (ref 0.0–5.0)
HCT: 36 % (ref 36.0–46.0)
Hemoglobin: 12.2 g/dL (ref 12.0–15.0)
Lymphocytes Relative: 26.1 % (ref 12.0–46.0)
Lymphs Abs: 2.5 10*3/uL (ref 0.7–4.0)
MCHC: 33.9 g/dL (ref 30.0–36.0)
MCV: 84.6 fl (ref 78.0–100.0)
Monocytes Absolute: 0.7 10*3/uL (ref 0.1–1.0)
Monocytes Relative: 7.5 % (ref 3.0–12.0)
Neutro Abs: 6 10*3/uL (ref 1.4–7.7)
Neutrophils Relative %: 63.4 % (ref 43.0–77.0)
Platelets: 328 10*3/uL (ref 150.0–400.0)
RBC: 4.25 Mil/uL (ref 3.87–5.11)
RDW: 15.1 % (ref 11.5–15.5)
WBC: 9.5 10*3/uL (ref 4.0–10.5)

## 2021-12-07 LAB — HEPATIC FUNCTION PANEL
ALT: 10 U/L (ref 0–35)
AST: 11 U/L (ref 0–37)
Albumin: 4.2 g/dL (ref 3.5–5.2)
Alkaline Phosphatase: 76 U/L (ref 39–117)
Bilirubin, Direct: 0 mg/dL (ref 0.0–0.3)
Total Bilirubin: 0.2 mg/dL (ref 0.2–1.2)
Total Protein: 7.2 g/dL (ref 6.0–8.3)

## 2021-12-07 LAB — BASIC METABOLIC PANEL
BUN: 8 mg/dL (ref 6–23)
CO2: 25 mEq/L (ref 19–32)
Calcium: 8.9 mg/dL (ref 8.4–10.5)
Chloride: 105 mEq/L (ref 96–112)
Creatinine, Ser: 0.5 mg/dL (ref 0.40–1.20)
GFR: 119.66 mL/min (ref >60.00)
Glucose, Bld: 86 mg/dL (ref 70–99)
Potassium: 4.1 mEq/L (ref 3.5–5.1)
Sodium: 138 mEq/L (ref 135–145)

## 2021-12-07 LAB — LIPID PANEL
Cholesterol: 165 mg/dL (ref 0–200)
HDL: 46.5 mg/dL (ref >39.00)
LDL Cholesterol: 102 mg/dL — ABNORMAL HIGH (ref 0–99)
NonHDL: 118.43
Total CHOL/HDL Ratio: 4
Triglycerides: 83 mg/dL (ref 0.0–149.0)
VLDL: 16.6 mg/dL (ref 0.0–40.0)

## 2021-12-07 LAB — HEMOGLOBIN A1C: Hgb A1c MFr Bld: 5.3 % (ref 4.6–6.5)

## 2021-12-07 LAB — TSH: TSH: 1.35 u[IU]/mL (ref 0.35–5.50)

## 2021-12-08 ENCOUNTER — Ambulatory Visit (INDEPENDENT_AMBULATORY_CARE_PROVIDER_SITE_OTHER): Payer: No Typology Code available for payment source | Admitting: Family Medicine

## 2021-12-08 ENCOUNTER — Encounter: Payer: Self-pay | Admitting: Family Medicine

## 2021-12-08 VITALS — BP 118/80 | HR 85 | Temp 98.6°F | Wt 145.0 lb

## 2021-12-08 DIAGNOSIS — Z0000 Encounter for general adult medical examination without abnormal findings: Secondary | ICD-10-CM | POA: Diagnosis not present

## 2021-12-08 DIAGNOSIS — E785 Hyperlipidemia, unspecified: Secondary | ICD-10-CM | POA: Diagnosis not present

## 2021-12-08 DIAGNOSIS — R739 Hyperglycemia, unspecified: Secondary | ICD-10-CM | POA: Diagnosis not present

## 2021-12-08 NOTE — Progress Notes (Signed)
? ?  Subjective:  ? ? Patient ID: Brandi Wagner, female    DOB: May 29, 1986, 36 y.o.   MRN: [id number] ? ?HPI ?Here for a well exam. She feels good and has no concerns. She had been seeing Cardiology every few years to follow her VSD, but she has not seen them for awhile. Her last ECHO on 04-10-20 showed normal LVEF and the small VSD allowed a small left to right shunt.  ? ? ?Review of Systems  ?Constitutional: Negative.   ?HENT: Negative.    ?Eyes: Negative.   ?Respiratory: Negative.    ?Cardiovascular: Negative.   ?Gastrointestinal: Negative.   ?Genitourinary:  Negative for decreased urine volume, difficulty urinating, dyspareunia, dysuria, enuresis, flank pain, frequency, hematuria, pelvic pain and urgency.  ?Musculoskeletal: Negative.   ?Skin: Negative.   ?Neurological: Negative.  Negative for headaches.  ?Psychiatric/Behavioral: Negative.    ? ?   ?Objective:  ? Physical Exam ?Constitutional:   ?   General: She is not in acute distress. ?   Appearance: Normal appearance. She is well-developed.  ?HENT:  ?   Head: Normocephalic and atraumatic.  ?   Right Ear: External ear normal.  ?   Left Ear: External ear normal.  ?   Nose: Nose normal.  ?   Mouth/Throat:  ?   Pharynx: No oropharyngeal exudate.  ?Eyes:  ?   General: No scleral icterus. ?   Conjunctiva/sclera: Conjunctivae normal.  ?   Pupils: Pupils are equal, round, and reactive to light.  ?Neck:  ?   Thyroid: No thyromegaly.  ?   Vascular: No JVD.  ?Cardiovascular:  ?   Rate and Rhythm: Normal rate and regular rhythm.  ?   Pulses: Normal pulses.  ?   Heart sounds: No murmur heard. ?  No friction rub. No gallop.  ?   Comments: There is a 2/6 SM loudest over the left sternal border  ?Pulmonary:  ?   Effort: Pulmonary effort is normal. No respiratory distress.  ?   Breath sounds: Normal breath sounds. No wheezing or rales.  ?Chest:  ?   Chest wall: No tenderness.  ?Abdominal:  ?   General: Bowel sounds are normal. There is no distension.  ?   Palpations: Abdomen is  soft. There is no mass.  ?   Tenderness: There is no abdominal tenderness. There is no guarding or rebound.  ?Musculoskeletal:     ?   General: No tenderness. Normal range of motion.  ?   Cervical back: Normal range of motion and neck supple.  ?Lymphadenopathy:  ?   Cervical: No cervical adenopathy.  ?Skin: ?   General: Skin is warm and dry.  ?   Findings: No erythema or rash.  ?Neurological:  ?   Mental Status: She is alert and oriented to person, place, and time.  ?   Cranial Nerves: No cranial nerve deficit.  ?   Motor: No abnormal muscle tone.  ?   Coordination: Coordination normal.  ?   Deep Tendon Reflexes: Reflexes are normal and symmetric. Reflexes normal.  ?Psychiatric:     ?   Behavior: Behavior normal.     ?   Thought Content: Thought content normal.     ?   Judgment: Judgment normal.  ? ? ? ? ? ?   ?Assessment & Plan:  ?Well exam. We discussed diet and exercise. She had fasting labs done eariler this week, and we reviewed these together.  ?Gershon Crane, MD ? ? ?

## 2023-01-19 ENCOUNTER — Ambulatory Visit (INDEPENDENT_AMBULATORY_CARE_PROVIDER_SITE_OTHER): Payer: No Typology Code available for payment source | Admitting: Family Medicine

## 2023-01-19 ENCOUNTER — Encounter: Payer: Self-pay | Admitting: Family Medicine

## 2023-01-19 VITALS — BP 120/80 | HR 88 | Temp 98.6°F | Ht 64.0 in | Wt 142.6 lb

## 2023-01-19 DIAGNOSIS — Q210 Ventricular septal defect: Secondary | ICD-10-CM | POA: Diagnosis not present

## 2023-01-19 DIAGNOSIS — Z0000 Encounter for general adult medical examination without abnormal findings: Secondary | ICD-10-CM

## 2023-01-19 LAB — LIPID PANEL
Cholesterol: 182 mg/dL (ref 0–200)
HDL: 46 mg/dL (ref >39.00)
LDL Cholesterol: 112 mg/dL — ABNORMAL HIGH (ref 0–99)
NonHDL: 136.24
Total CHOL/HDL Ratio: 4
Triglycerides: 123 mg/dL (ref 0.0–149.0)
VLDL: 24.6 mg/dL (ref 0.0–40.0)

## 2023-01-19 LAB — CBC WITH DIFFERENTIAL/PLATELET
Basophils Absolute: 0.1 10*3/uL (ref 0.0–0.1)
Basophils Relative: 0.8 % (ref 0.0–3.0)
Eosinophils Absolute: 0.2 10*3/uL (ref 0.0–0.7)
Eosinophils Relative: 1.8 % (ref 0.0–5.0)
HCT: 37.8 % (ref 36.0–46.0)
Hemoglobin: 11.8 g/dL — ABNORMAL LOW (ref 12.0–15.0)
Lymphocytes Relative: 21.2 % (ref 12.0–46.0)
Lymphs Abs: 2.1 10*3/uL (ref 0.7–4.0)
MCHC: 31.2 g/dL (ref 30.0–36.0)
MCV: 78.8 fl (ref 78.0–100.0)
Monocytes Absolute: 0.7 10*3/uL (ref 0.1–1.0)
Monocytes Relative: 6.6 % (ref 3.0–12.0)
Neutro Abs: 7 10*3/uL (ref 1.4–7.7)
Neutrophils Relative %: 69.6 % (ref 43.0–77.0)
Platelets: 370 10*3/uL (ref 150.0–400.0)
RBC: 4.79 Mil/uL (ref 3.87–5.11)
RDW: 17.4 % — ABNORMAL HIGH (ref 11.5–15.5)
WBC: 10 10*3/uL (ref 4.0–10.5)

## 2023-01-19 LAB — BASIC METABOLIC PANEL
BUN: 8 mg/dL (ref 6–23)
CO2: 25 mEq/L (ref 19–32)
Calcium: 9.4 mg/dL (ref 8.4–10.5)
Chloride: 103 mEq/L (ref 96–112)
Creatinine, Ser: 0.67 mg/dL (ref 0.40–1.20)
GFR: 110.64 mL/min (ref >60.00)
Glucose, Bld: 79 mg/dL (ref 70–99)
Potassium: 3.9 mEq/L (ref 3.5–5.1)
Sodium: 135 mEq/L (ref 135–145)

## 2023-01-19 LAB — HEPATIC FUNCTION PANEL
ALT: 12 U/L (ref 0–35)
AST: 17 U/L (ref 0–37)
Albumin: 4.4 g/dL (ref 3.5–5.2)
Alkaline Phosphatase: 79 U/L (ref 39–117)
Bilirubin, Direct: 0.1 mg/dL (ref 0.0–0.3)
Total Bilirubin: 0.3 mg/dL (ref 0.2–1.2)
Total Protein: 7.7 g/dL (ref 6.0–8.3)

## 2023-01-19 LAB — TSH: TSH: 1.67 u[IU]/mL (ref 0.35–5.50)

## 2023-01-19 LAB — HEMOGLOBIN A1C: Hgb A1c MFr Bld: 5.6 % (ref 4.6–6.5)

## 2023-01-19 NOTE — Progress Notes (Signed)
Subjective:    Patient ID: Brandi Wagner, female    DOB: 02-28-1986, 37 y.o.   MRN: [id number]  HPI Here for a well exam. She feels great and has no concerns. She and her husband are considering having another child. The last time she saw Dr. Tenny Craw and had an ECHO was in 2021.    Review of Systems  Constitutional: Negative.   HENT: Negative.    Eyes: Negative.   Respiratory: Negative.    Cardiovascular: Negative.   Gastrointestinal: Negative.   Genitourinary:  Negative for decreased urine volume, difficulty urinating, dyspareunia, dysuria, enuresis, flank pain, frequency, hematuria, pelvic pain and urgency.  Musculoskeletal: Negative.   Skin: Negative.   Neurological: Negative.  Negative for headaches.  Psychiatric/Behavioral: Negative.         Objective:   Physical Exam Constitutional:      General: She is not in acute distress.    Appearance: Normal appearance. She is well-developed.  HENT:     Head: Normocephalic and atraumatic.     Right Ear: External ear normal.     Left Ear: External ear normal.     Nose: Nose normal.     Mouth/Throat:     Pharynx: No oropharyngeal exudate.  Eyes:     General: No scleral icterus.    Conjunctiva/sclera: Conjunctivae normal.     Pupils: Pupils are equal, round, and reactive to light.  Neck:     Thyroid: No thyromegaly.     Vascular: No JVD.  Cardiovascular:     Rate and Rhythm: Normal rate and regular rhythm.     Pulses: Normal pulses.     Heart sounds: No murmur heard.    No friction rub. No gallop.     Comments: 3/6 holosystolic murmur Pulmonary:     Effort: Pulmonary effort is normal. No respiratory distress.     Breath sounds: Normal breath sounds. No wheezing or rales.  Chest:     Chest wall: No tenderness.  Abdominal:     General: Bowel sounds are normal. There is no distension.     Palpations: Abdomen is soft. There is no mass.     Tenderness: There is no abdominal tenderness. There is no guarding or rebound.   Musculoskeletal:        General: No tenderness. Normal range of motion.     Cervical back: Normal range of motion and neck supple.  Lymphadenopathy:     Cervical: No cervical adenopathy.  Skin:    General: Skin is warm and dry.     Findings: No erythema or rash.  Neurological:     General: No focal deficit present.     Mental Status: She is alert and oriented to person, place, and time.     Cranial Nerves: No cranial nerve deficit.     Motor: No abnormal muscle tone.     Coordination: Coordination normal.     Deep Tendon Reflexes: Reflexes are normal and symmetric. Reflexes normal.  Psychiatric:        Mood and Affect: Mood normal.        Behavior: Behavior normal.        Thought Content: Thought content normal.        Judgment: Judgment normal.           Assessment & Plan:  Well exam. We discussed diet and exercise. Get fasting labs. We will refer her back to Cardiology. I think getting another ECHO would be a good idea . Gershon Crane,  MD

## 2023-04-05 ENCOUNTER — Ambulatory Visit: Payer: No Typology Code available for payment source | Admitting: Internal Medicine

## 2023-06-09 ENCOUNTER — Ambulatory Visit: Payer: No Typology Code available for payment source

## 2023-06-09 ENCOUNTER — Encounter: Payer: Self-pay | Admitting: Family Medicine

## 2023-06-09 ENCOUNTER — Ambulatory Visit (INDEPENDENT_AMBULATORY_CARE_PROVIDER_SITE_OTHER): Payer: No Typology Code available for payment source | Admitting: Family Medicine

## 2023-06-09 VITALS — BP 110/78 | HR 92 | Temp 98.7°F | Wt 139.6 lb

## 2023-06-09 DIAGNOSIS — R059 Cough, unspecified: Secondary | ICD-10-CM

## 2023-06-09 DIAGNOSIS — J189 Pneumonia, unspecified organism: Secondary | ICD-10-CM

## 2023-06-09 LAB — POCT INFLUENZA A/B
Influenza A, POC: NEGATIVE
Influenza B, POC: NEGATIVE

## 2023-06-09 LAB — POC COVID19 BINAXNOW: SARS Coronavirus 2 Ag: NEGATIVE

## 2023-06-09 MED ORDER — AMOXICILLIN-POT CLAVULANATE 875-125 MG PO TABS: 1.0000 | ORAL_TABLET | Freq: Two times a day (BID) | ORAL | 0 refills | Status: DC

## 2023-06-09 MED ORDER — CEFTRIAXONE SODIUM 1 G IJ SOLR
1.0000 g | Freq: Once | INTRAMUSCULAR | Status: AC
  Administered 2023-06-09: 1 g via INTRAMUSCULAR

## 2023-06-09 MED ORDER — HYDROCODONE BIT-HOMATROP MBR 5-1.5 MG/5ML PO SOLN
5.0000 mL | ORAL | 0 refills | Status: DC | PRN
Start: 2023-06-09 — End: 2023-06-21

## 2023-06-09 NOTE — Addendum Note (Signed)
Addended by: Carola Rhine on: 06/09/2023 11:03 AM   Modules accepted: Orders

## 2023-06-09 NOTE — Progress Notes (Signed)
   Subjective:    Patient ID: Brandi Wagner, female    DOB: 29-Nov-1985, 37 y.o.   MRN: [id number]  HPI Here for 2 weeks of coughing which is non-productive. She did not feel too bad until 3 days ago when she developed fever and a sharp severe pain in the left chest area that radiates to the left shoulder blade. She feels this pain when she coughs or takes a deep breath. She is not SOB. Taking Nyquil and Dayquil.    Review of Systems  Constitutional:  Positive for fever.  HENT: Negative.    Eyes: Negative.   Respiratory:  Positive for cough and chest tightness. Negative for shortness of breath and wheezing.   Cardiovascular:  Positive for chest pain. Negative for palpitations and leg swelling.       Objective:   Physical Exam Constitutional:      Appearance: Normal appearance. She is not ill-appearing.  HENT:     Right Ear: Tympanic membrane, ear canal and external ear normal.     Left Ear: Tympanic membrane, ear canal and external ear normal.     Nose: Nose normal.     Mouth/Throat:     Pharynx: Oropharynx is clear.  Eyes:     Conjunctiva/sclera: Conjunctivae normal.  Cardiovascular:     Rate and Rhythm: Normal rate and regular rhythm.     Pulses: Normal pulses.     Heart sounds: Normal heart sounds.  Pulmonary:     Effort: Pulmonary effort is normal. No respiratory distress.     Breath sounds: No wheezing, rhonchi or rales.     Comments: Breaths sounds are quiet throughout because she is splinting due to pain. I can hear consolidation in the left upper lung area  Chest:     Chest wall: No tenderness.  Lymphadenopathy:     Cervical: No cervical adenopathy.  Neurological:     Mental Status: She is alert.           Assessment & Plan:  This likely a LUL pneumonia. We will give her a shot of Rocephin as well as 10 days of Augmentin. Get a CXR today.  Gershon Crane, MD

## 2023-06-13 ENCOUNTER — Encounter: Payer: Self-pay | Admitting: Family Medicine

## 2023-06-15 ENCOUNTER — Encounter: Payer: Self-pay | Admitting: Family Medicine

## 2023-06-15 MED ORDER — FLUCONAZOLE 150 MG PO TABS: 150.0000 mg | ORAL_TABLET | Freq: Every day | ORAL | 11 refills | Status: DC | PRN

## 2023-06-15 NOTE — Telephone Encounter (Signed)
I sent in the RX for Diflucan

## 2023-06-21 ENCOUNTER — Ambulatory Visit: Payer: No Typology Code available for payment source | Attending: Internal Medicine | Admitting: Internal Medicine

## 2023-06-21 ENCOUNTER — Encounter: Payer: Self-pay | Admitting: Internal Medicine

## 2023-06-21 VITALS — BP 104/62 | HR 81 | Ht 63.0 in | Wt 139.0 lb

## 2023-06-21 DIAGNOSIS — Q210 Ventricular septal defect: Secondary | ICD-10-CM | POA: Diagnosis not present

## 2023-06-21 DIAGNOSIS — I493 Ventricular premature depolarization: Secondary | ICD-10-CM

## 2023-06-21 DIAGNOSIS — Z8679 Personal history of other diseases of the circulatory system: Secondary | ICD-10-CM | POA: Diagnosis not present

## 2023-06-21 NOTE — Patient Instructions (Signed)
Medication Instructions:   *If you need a refill on your cardiac medications before your next appointment, please call your pharmacy*   Lab Work:  If you have labs (blood work) drawn today and your tests are completely normal, you will receive your results only by: MyChart Message (if you have MyChart) OR A paper copy in the mail If you have any lab test that is abnormal or we need to change your treatment, we will call you to review the results.   Testing/Procedures:    Follow-Up: At [facility], you and your health needs are our priority.  As part of our continuing mission to provide you with exceptional heart care, we have created designated Provider Care Teams.  These Care Teams include your primary Cardiologist (physician) and Advanced Practice Providers (APPs -  Physician Assistants and Nurse Practitioners) who all work together to provide you with the care you need, when you need it.  We recommend signing up for the patient portal called "MyChart".  Sign up information is provided on this After Visit Summary.  MyChart is used to connect with patients for Virtual Visits (Telemedicine).  Patients are able to view lab/test results, encounter notes, upcoming appointments, etc.  Non-urgent messages can be sent to your provider as well.   To learn more about what you can do with MyChart, go to [email or web address].    Your next appointment:   2 year(s)  Provider:   Dietrich Pates, MD     Other Instructions

## 2023-06-21 NOTE — Progress Notes (Unsigned)
Cardiology Office Note   Date:  06/22/2023   ID:  Brandi Wagner Dec 22, 1985, MRN [id number]  PCP:  Nelwyn Salisbury, MD  Cardiologist:   Dietrich Pates, MD   Patient presents for follow up of VSD    History of Present Illness: Brandi Wagner is a 37 y.o. female with a history of a small perimembranous VSD and palpitations    I last saw the pt in 2015  She was last seen in cardiology in Aug 2021   Echo in Sept 2016 showed small perimembranous VSD  no AI    Monitor showed SR   6% PVCs   Recomm low dose Toprol XL   Since seen has done OK except overall   She is recovering from pneumonia   She says her breathing is good   She denies palpitations  She is not taking Toprol Active with a 43 year old at home   (no congenital defect in daughter)    Current Meds  Medication Sig   sertraline (ZOLOFT) 50 MG tablet as needed (anxiety).     Allergies:   Promethazine, Promethazine hcl, and Other   Past Medical History:  Diagnosis Date   Allergic rhinitis    Eczema    GERD (gastroesophageal reflux disease)    Gestational diabetes    Headache(784.0)    HSV (herpes simplex virus) anogenital infection    PVC's (premature ventricular contractions)    3-day monitor 9/21: Sinus rhythm, average heart rate 84; PVCs (6.2%), one episode of SVT (short burst PAT) x8 beats   Ventricular septal defect    sees Dr. Dietrich Pates // Echo 9/21: EF 55-60, no RWMA, GR 1 DD, small perimembranous VSD with left-to-right flow, normal RVSF, RVSP 13.1, trivial MR, no AI     Past Surgical History:  Procedure Laterality Date   CESAREAN SECTION N/A 04/29/2019   Procedure: CESAREAN SECTION;  Surgeon: Marlow Baars, MD;  Location: [facility] LD ORS;  Service: Obstetrics;  Laterality: N/A;   CHOLECYSTECTOMY       Social History:  The patient  reports that she has never smoked. She has never used smokeless tobacco. She reports that she does not currently use alcohol after a past usage of about 2.0 standard drinks of  alcohol per week. She reports that she does not use drugs.   Family History:  The patient's family history includes Anuerysm in her mother; Hypertension in her mother and another family member.    ROS:  Please see the history of present illness. All other systems are reviewed and  Negative to the above problem except as noted.    PHYSICAL EXAM: VS:  BP 104/62   Pulse 81   Ht 5\' 3"  (1.6 m)   Wt 139 lb (63 kg)   SpO2 97%   BMI 24.62 kg/m   GEN: Well nourished, well developed, in no acute distress  HEENT: normal  Neck: no JVD, no carotid bruits Cardiac: RRR; II/VI systolic murmur L sternal border to outflow area.  No diastolic murmurs heard    No LE  edema  Respiratory:  clear to auscultation bilaterally GI: soft, nontender, No hepatomegaly   EKG:  EKG is ordered today.  NSR 81 bpm   RBBB  Unchanged  Echo   2021  1. Left ventricular ejection fraction, by estimation, is 55 to 60%. The  left ventricle has normal function. The left ventricle has no regional  wall motion abnormalities. Left ventricular diastolic parameters are  consistent  with Grade I diastolic  dysfunction (impaired relaxation).   2. Small perimembranous VSD with left to right flow, no evidence for  significant hemodynamic effect.   3. Right ventricular systolic function is normal. The right ventricular  size is normal. There is normal pulmonary artery systolic pressure. The  estimated right ventricular systolic pressure is 13.1 mmHg.   4. The mitral valve is normal in structure. Trivial mitral valve  regurgitation. No evidence of mitral stenosis.   5. The aortic valve is tricuspid. Aortic valve regurgitation is not  visualized. No aortic stenosis is present.   6. The inferior vena cava is normal in size with greater than 50%  respiratory variability, suggesting right atrial pressure of 3 mmHg.    Monitor    SR  6% PVCs    Lipid Panel    Component Value Date/Time   CHOL 182 01/19/2023 0942   TRIG 123.0  01/19/2023 0942   HDL 46.00 01/19/2023 0942   CHOLHDL 4 01/19/2023 0942   VLDL 24.6 01/19/2023 0942   LDLCALC 112 (H) 01/19/2023 0942      Wt Readings from Last 3 Encounters:  06/21/23 139 lb (63 kg)  06/09/23 139 lb 9.6 oz (63.3 kg)  01/19/23 142 lb 9.6 oz (64.7 kg)      ASSESSMENT AND PLAN:  1  Perimembranous VSD   Pt with small VSD   No aortic insuff noted on previous echo   There is no diastolic murmur on exam today    Would follow for now    Note patient says last echo was extremely expensive       2  Palpitations   Patient denies symptoms   3  Lipids   LDL 112  HDL 46  Trig 123 Reviewed diet  Gave her reference for [employer] bood    REcomm minimally processed foods   Lots of veggies   Plan for follow up in 2 years   Current medicines are reviewed at length with the patient today.  The patient does not have concerns regarding medicines.  Signed, Dietrich Pates, MD  06/22/2023 6:45 PM    [facility] [address] [place], [place], [place]  [address] Phone: [phone]; Fax: [phone]

## 2023-09-29 ENCOUNTER — Telehealth: Admitting: Nurse Practitioner

## 2023-09-29 DIAGNOSIS — J40 Bronchitis, not specified as acute or chronic: Secondary | ICD-10-CM

## 2023-09-29 MED ORDER — PSEUDOEPH-BROMPHEN-DM 30-2-10 MG/5ML PO SYRP: 5.0000 mL | ORAL_SOLUTION | Freq: Four times a day (QID) | ORAL | 0 refills | Status: DC | PRN

## 2023-09-29 MED ORDER — BENZONATATE 200 MG PO CAPS: 200.0000 mg | ORAL_CAPSULE | Freq: Two times a day (BID) | ORAL | 0 refills | Status: DC | PRN

## 2023-09-29 MED ORDER — AZITHROMYCIN 250 MG PO TABS: ORAL_TABLET | ORAL | 0 refills | Status: AC

## 2023-09-29 NOTE — Patient Instructions (Signed)
  Brandi Wagner, thank you for joining Claiborne Rigg, NP for today's virtual visit.  While this provider is not your primary care provider (PCP), if your PCP is located in our provider database this encounter information will be shared with them immediately following your visit.   A [facility] MyChart account gives you access to today's visit and all your visits, tests, and labs performed at [facility] " click here if you don't have a [facility] MyChart account or go to [email or web address]  Consent: (Patient) Brandi Wagner provided verbal consent for this virtual visit at the beginning of the encounter.  Current Medications:  Current Outpatient Medications:    azithromycin (ZITHROMAX) 250 MG tablet, Take 2 tablets on day 1, then 1 tablet daily on days 2 through 5, Disp: 6 tablet, Rfl: 0   benzonatate (TESSALON) 200 MG capsule, Take 1 capsule (200 mg total) by mouth 2 (two) times daily as needed for cough., Disp: 20 capsule, Rfl: 0   brompheniramine-pseudoephedrine-DM 30-2-10 MG/5ML syrup, Take 5 mLs by mouth 4 (four) times daily as needed., Disp: 240 mL, Rfl: 0   sertraline (ZOLOFT) 50 MG tablet, as needed (anxiety)., Disp: , Rfl:    Medications ordered in this encounter:  Meds ordered this encounter  Medications   azithromycin (ZITHROMAX) 250 MG tablet    Sig: Take 2 tablets on day 1, then 1 tablet daily on days 2 through 5    Dispense:  6 tablet    Refill:  0    Supervising Provider:   Merrilee Jansky [[id number]]   brompheniramine-pseudoephedrine-DM 30-2-10 MG/5ML syrup    Sig: Take 5 mLs by mouth 4 (four) times daily as needed.    Dispense:  240 mL    Refill:  0    Supervising Provider:   Merrilee Jansky [[id number]]   benzonatate (TESSALON) 200 MG capsule    Sig: Take 1 capsule (200 mg total) by mouth 2 (two) times daily as needed for cough.    Dispense:  20 capsule    Refill:  0    Supervising Provider:   Merrilee Jansky [[id number]]     *If you need  refills on other medications prior to your next appointment, please contact your pharmacy*  Follow-Up: Call back or seek an in-person evaluation if the symptoms worsen or if the condition fails to improve as anticipated.  [facility] [phone]  Other Instructions INSTRUCTIONS: use a humidifier for nasal congestion Drink plenty of fluids, rest and wash hands frequently to avoid the spread of infection Alternate tylenol and Motrin for relief of fever    If you have been instructed to have an in-person evaluation today at a local Urgent Care facility, please use the link below. It will take you to a list of all of our available [facility], including address, phone number and hours of operation. Please do not delay care.  [facility]  If you or a family member do not have a primary care provider, use the link below to schedule a visit and establish care. When you choose a [facility] primary care physician or advanced practice provider, you gain a long-term partner in health. Find a Primary Care Provider  Learn more about [facility]'s in-office and virtual care options: [facility] - Get Care Now

## 2023-09-29 NOTE — Progress Notes (Signed)
 Virtual Visit Consent   Brandi Wagner, you are scheduled for a virtual visit with a [facility] provider today. Just as with appointments in the office, your consent must be obtained to participate. Your consent will be active for this visit and any virtual visit you may have with one of our providers in the next 365 days. If you have a MyChart account, a copy of this consent can be sent to you electronically.  As this is a virtual visit, video technology does not allow for your provider to perform a traditional examination. This may limit your provider's ability to fully assess your condition. If your provider identifies any concerns that need to be evaluated in person or the need to arrange testing (such as labs, EKG, etc.), we will make arrangements to do so. Although advances in technology are sophisticated, we cannot ensure that it will always work on either your end or our end. If the connection with a video visit is poor, the visit may have to be switched to a telephone visit. With either a video or telephone visit, we are not always able to ensure that we have a secure connection.  By engaging in this virtual visit, you consent to the provision of healthcare and authorize for your insurance to be billed (if applicable) for the services provided during this visit. Depending on your insurance coverage, you may receive a charge related to this service.  I need to obtain your verbal consent now. Are you willing to proceed with your visit today? ANELIA CARRIVEAU has provided verbal consent on 09/29/2023 for a virtual visit (video or telephone). Claiborne Rigg, NP  Date: 09/29/2023 3:08 PM   Virtual Visit via Video Note   I, Claiborne Rigg, connected with  TAHIRI SHAREEF  ([id number], August 11, 1985) on 09/29/23 at  3:00 PM EST by a video-enabled telemedicine application and verified that I am speaking with the correct person using two identifiers.  Location: Patient: Virtual Visit Location Patient:  Home Provider: Virtual Visit Location Provider: Home Office   I discussed the limitations of evaluation and management by telemedicine and the availability of in person appointments. The patient expressed understanding and agreed to proceed.    History of Present Illness: Brandi Wagner is a 38 y.o. who identifies as a female who was assigned female at birth, and is being seen today for bronchitis.  Over the past 2 wekes Ms. Mikulski has been experiencing dry hacking cough. She was treated for PNA 4 months ago as well. She does smoke but not daily. Has seasonal allergies but takes an antihistamine daily. Using robitussin currently but the cough is keeping her up at night. Associated symptoms: persistent post nasal drip and and drainage in throat.   Problems:  Patient Active Problem List   Diagnosis Date Noted   PVC's (premature ventricular contractions)    Advanced maternal age, 1st pregnancy, third trimester 04/28/2019   Shingles 06/13/2015   ECZEMA 09/16/2009   VENTRICULAR SEPTAL DEFECT 09/16/2009   FATIGUE 09/16/2009   ALLERGIC RHINITIS 11/27/2007   GERD 05/09/2007   HEADACHE 05/09/2007   CARDIAC MURMUR, HX OF 05/09/2007    Allergies:  Allergies  Allergen Reactions   Promethazine Hives   Promethazine Hcl Hives   Other Hives   Medications:  Current Outpatient Medications:    azithromycin (ZITHROMAX) 250 MG tablet, Take 2 tablets on day 1, then 1 tablet daily on days 2 through 5, Disp: 6 tablet, Rfl: 0   benzonatate (TESSALON)  200 MG capsule, Take 1 capsule (200 mg total) by mouth 2 (two) times daily as needed for cough., Disp: 20 capsule, Rfl: 0   brompheniramine-pseudoephedrine-DM 30-2-10 MG/5ML syrup, Take 5 mLs by mouth 4 (four) times daily as needed., Disp: 240 mL, Rfl: 0   sertraline (ZOLOFT) 50 MG tablet, as needed (anxiety)., Disp: , Rfl:   Observations/Objective: Patient is well-developed, well-nourished in no acute distress.  Resting comfortably at home.  Head is  normocephalic, atraumatic.  No labored breathing.  Speech is clear and coherent with logical content.  Patient is alert and oriented at baseline.    Assessment and Plan: 1. Bronchitis (Primary) - azithromycin (ZITHROMAX) 250 MG tablet; Take 2 tablets on day 1, then 1 tablet daily on days 2 through 5  Dispense: 6 tablet; Refill: 0 - brompheniramine-pseudoephedrine-DM 30-2-10 MG/5ML syrup; Take 5 mLs by mouth 4 (four) times daily as needed.  Dispense: 240 mL; Refill: 0 - benzonatate (TESSALON) 200 MG capsule; Take 1 capsule (200 mg total) by mouth 2 (two) times daily as needed for cough.  Dispense: 20 capsule; Refill: 0   Follow Up Instructions: I discussed the assessment and treatment plan with the patient. The patient was provided an opportunity to ask questions and all were answered. The patient agreed with the plan and demonstrated an understanding of the instructions.  A copy of instructions were sent to the patient via MyChart unless otherwise noted below.     The patient was advised to call back or seek an in-person evaluation if the symptoms worsen or if the condition fails to improve as anticipated.    Claiborne Rigg, NP

## 2024-01-20 ENCOUNTER — Encounter: Payer: Self-pay | Admitting: Family Medicine

## 2024-01-20 ENCOUNTER — Ambulatory Visit (INDEPENDENT_AMBULATORY_CARE_PROVIDER_SITE_OTHER): Admitting: Family Medicine

## 2024-01-20 VITALS — BP 124/80 | HR 91 | Temp 98.6°F | Ht 64.5 in | Wt 148.8 lb

## 2024-01-20 DIAGNOSIS — Z0000 Encounter for general adult medical examination without abnormal findings: Secondary | ICD-10-CM | POA: Diagnosis not present

## 2024-01-20 DIAGNOSIS — E785 Hyperlipidemia, unspecified: Secondary | ICD-10-CM | POA: Diagnosis not present

## 2024-01-20 DIAGNOSIS — E559 Vitamin D deficiency, unspecified: Secondary | ICD-10-CM | POA: Diagnosis not present

## 2024-01-20 DIAGNOSIS — R739 Hyperglycemia, unspecified: Secondary | ICD-10-CM | POA: Diagnosis not present

## 2024-01-20 NOTE — Progress Notes (Signed)
   Subjective:    Patient ID: Brandi BLACKSHER, female    DOB: 28-Nov-1985, 38 y.o.   MRN: [id number]  HPI Here for a well exam. She feels fine. She sees Dr. Okey yearly for her hx of VSD. Her last ECHO was in 2021.    Review of Systems  Constitutional: Negative.   HENT: Negative.    Eyes: Negative.   Respiratory: Negative.    Cardiovascular: Negative.   Gastrointestinal: Negative.   Genitourinary:  Negative for decreased urine volume, difficulty urinating, dyspareunia, dysuria, enuresis, flank pain, frequency, hematuria, pelvic pain and urgency.  Musculoskeletal: Negative.   Skin: Negative.   Neurological: Negative.  Negative for headaches.  Psychiatric/Behavioral: Negative.         Objective:   Physical Exam Constitutional:      General: She is not in acute distress.    Appearance: Normal appearance. She is well-developed.  HENT:     Head: Normocephalic and atraumatic.     Right Ear: External ear normal.     Left Ear: External ear normal.     Nose: Nose normal.     Mouth/Throat:     Pharynx: No oropharyngeal exudate.   Eyes:     General: No scleral icterus.    Conjunctiva/sclera: Conjunctivae normal.     Pupils: Pupils are equal, round, and reactive to light.   Neck:     Thyroid : No thyromegaly.     Vascular: No JVD.   Cardiovascular:     Rate and Rhythm: Normal rate and regular rhythm.     Heart sounds:     No friction rub. No gallop.     Comments: 3/6 holosystolic murmur  Pulmonary:     Effort: Pulmonary effort is normal. No respiratory distress.     Breath sounds: Normal breath sounds. No wheezing or rales.  Chest:     Chest wall: No tenderness.  Abdominal:     General: Bowel sounds are normal. There is no distension.     Palpations: Abdomen is soft. There is no mass.     Tenderness: There is no abdominal tenderness. There is no guarding or rebound.   Musculoskeletal:        General: No tenderness. Normal range of motion.     Cervical back: Normal range  of motion and neck supple.  Lymphadenopathy:     Cervical: No cervical adenopathy.   Skin:    General: Skin is warm and dry.     Findings: No erythema or rash.   Neurological:     General: No focal deficit present.     Mental Status: She is alert and oriented to person, place, and time.     Cranial Nerves: No cranial nerve deficit.     Motor: No abnormal muscle tone.     Coordination: Coordination normal.     Deep Tendon Reflexes: Reflexes are normal and symmetric. Reflexes normal.   Psychiatric:        Mood and Affect: Mood normal.        Behavior: Behavior normal.        Thought Content: Thought content normal.        Judgment: Judgment normal.           Assessment & Plan:  Well exam. We discussed diet and exercise. Get fasting labs. Garnette Olmsted, MD

## 2024-01-21 LAB — BASIC METABOLIC PANEL WITH GFR
BUN: 11 mg/dL (ref 7–25)
CO2: 24 mmol/L (ref 20–32)
Calcium: 9.1 mg/dL (ref 8.6–10.2)
Chloride: 104 mmol/L (ref 98–110)
Creat: 0.68 mg/dL (ref 0.50–0.97)
Glucose, Bld: 87 mg/dL (ref 65–99)
Potassium: 3.8 mmol/L (ref 3.5–5.3)
Sodium: 137 mmol/L (ref 135–146)
eGFR: 114 mL/min/{1.73_m2} (ref >=60)

## 2024-01-21 LAB — HEMOGLOBIN A1C
Hgb A1c MFr Bld: 5.3 % (ref <5.7)
Mean Plasma Glucose: 105 mg/dL
eAG (mmol/L): 5.8 mmol/L

## 2024-01-21 LAB — HEPATIC FUNCTION PANEL
AG Ratio: 1.6 (calc) (ref 1.0–2.5)
ALT: 9 U/L (ref 6–29)
AST: 12 U/L (ref 10–30)
Albumin: 4.3 g/dL (ref 3.6–5.1)
Alkaline phosphatase (APISO): 78 U/L (ref 31–125)
Bilirubin, Direct: 0.1 mg/dL (ref 0.0–0.2)
Globulin: 2.7 g/dL (ref 1.9–3.7)
Indirect Bilirubin: 0.4 mg/dL (ref 0.2–1.2)
Total Bilirubin: 0.5 mg/dL (ref 0.2–1.2)
Total Protein: 7 g/dL (ref 6.1–8.1)

## 2024-01-21 LAB — CBC WITH DIFFERENTIAL/PLATELET
Absolute Lymphocytes: 2921 {cells}/uL (ref 850–3900)
Absolute Monocytes: 953 {cells}/uL — ABNORMAL HIGH (ref 200–950)
Basophils Absolute: 76 {cells}/uL (ref 0–200)
Basophils Relative: 0.6 %
Eosinophils Absolute: 203 {cells}/uL (ref 15–500)
Eosinophils Relative: 1.6 %
HCT: 37.9 % (ref 35.0–45.0)
Hemoglobin: 11.9 g/dL (ref 11.7–15.5)
MCH: 26 pg — ABNORMAL LOW (ref 27.0–33.0)
MCHC: 31.4 g/dL — ABNORMAL LOW (ref 32.0–36.0)
MCV: 82.9 fL (ref 80.0–100.0)
MPV: 9.2 fL (ref 7.5–12.5)
Monocytes Relative: 7.5 %
Neutro Abs: 8547 {cells}/uL — ABNORMAL HIGH (ref 1500–7800)
Neutrophils Relative %: 67.3 %
Platelets: 340 10*3/uL (ref 140–400)
RBC: 4.57 10*6/uL (ref 3.80–5.10)
RDW: 16.2 % — ABNORMAL HIGH (ref 11.0–15.0)
Total Lymphocyte: 23 %
WBC: 12.7 10*3/uL — ABNORMAL HIGH (ref 3.8–10.8)

## 2024-01-21 LAB — LIPID PANEL
Cholesterol: 157 mg/dL (ref <200)
HDL: 41 mg/dL — ABNORMAL LOW (ref >=50)
LDL Cholesterol (Calc): 86 mg/dL
Non-HDL Cholesterol (Calc): 116 mg/dL (ref <130)
Total CHOL/HDL Ratio: 3.8 (calc) (ref <5.0)
Triglycerides: 208 mg/dL — ABNORMAL HIGH (ref <150)

## 2024-01-21 LAB — TSH: TSH: 1.36 m[IU]/L

## 2024-01-21 LAB — VITAMIN D 25 HYDROXY (VIT D DEFICIENCY, FRACTURES): Vit D, 25-Hydroxy: 45 ng/mL (ref 30–100)

## 2024-01-23 ENCOUNTER — Ambulatory Visit: Payer: Self-pay | Admitting: Family Medicine

## 2024-01-23 NOTE — Telephone Encounter (Signed)
 I will be happy to send in a RX for Zepbound, but which [facility] pharmacy does she prefer?

## 2024-01-24 ENCOUNTER — Telehealth: Payer: Self-pay

## 2024-01-24 NOTE — Telephone Encounter (Signed)
 Copied from CRM [id number]. Topic: Clinical - Medication Prior Auth >> Jan 24, 2024 11:44 AM Thersia BROCKS wrote: Reason for CRM: Patient called in regarding the prior authorization for her [facility], would like for Dr.Fry or nurses to give her a callback    ----------------------------------------------------------------------- From previous Reason for Contact - Prescription Issue: Reason for CRM:

## 2024-01-24 NOTE — Telephone Encounter (Signed)
 Please give her a call

## 2024-01-25 ENCOUNTER — Telehealth: Payer: Self-pay

## 2024-01-25 ENCOUNTER — Other Ambulatory Visit: Payer: Self-pay | Admitting: Family Medicine

## 2024-01-25 MED ORDER — WEGOVY 0.25 MG/0.5ML ~~LOC~~ SOAJ: 0.2500 mg | SUBCUTANEOUS | 5 refills | Status: DC

## 2024-01-25 NOTE — Telephone Encounter (Signed)
 I can send in the [facility] but to which [facility] pharmacy?

## 2024-01-25 NOTE — Telephone Encounter (Signed)
 Pt is requesting for a new Rx for Wegovy states that her insurance covers this med with PA. Please advise

## 2024-01-25 NOTE — Telephone Encounter (Signed)
 I spoke with pt states that she want Rx sent for [facility] states her plan will cover with a PA. Request sent to Dr Johnny for advise

## 2024-01-25 NOTE — Addendum Note (Signed)
 Addended by: JOHNNY SENIOR A on: 01/25/2024 12:51 PM   Modules accepted: Orders

## 2024-01-25 NOTE — Telephone Encounter (Signed)
Send to PA team

## 2024-01-25 NOTE — Telephone Encounter (Signed)
 Copied from CRM [id number]. Topic: Clinical - Medication Prior Auth >> Jan 24, 2024 11:44 AM Thersia BROCKS wrote: Reason for CRM: Patient called in regarding the prior authorization for her [facility], would like for Dr.Fry or nurses to give her a callback    ----------------------------------------------------------------------- From previous Reason for Contact - Prescription Issue: Reason for CRM: >> Jan 25, 2024 11:23 AM [place] J wrote: Patient is following back up on her [facility] prescription. She stated that she would like that to be [facility] over to: [id number] [place], [place] - [address]  [address] [place] [address]  Phone: [phone] Fax: [phone]  Hours: Not open 24 hours

## 2024-01-25 NOTE — Telephone Encounter (Signed)
 Done

## 2024-01-25 NOTE — Telephone Encounter (Signed)
 This prescription requires a PA

## 2024-01-26 ENCOUNTER — Other Ambulatory Visit (HOSPITAL_COMMUNITY): Payer: Self-pay

## 2024-01-26 ENCOUNTER — Telehealth: Payer: Self-pay

## 2024-01-26 NOTE — Telephone Encounter (Signed)
 Pt notified that the PA is in process

## 2024-01-26 NOTE — Telephone Encounter (Signed)
 Pt Rx awaiting PA determination, pt is aware

## 2024-01-26 NOTE — Telephone Encounter (Signed)
 [facility] is aware that Rx is awaiting PA approval

## 2024-01-26 NOTE — Telephone Encounter (Signed)
 Pharmacy Patient Advocate Encounter   Received notification from RX Request Messages that prior authorization for [facility] 0.25MG /0.5ML is required/requested.   Insurance verification completed.   The patient is insured through [facility] ADVANTAGE/RX ADVANCE .   Per test claim: Drug is not covered. Plan exclusion

## 2024-01-30 ENCOUNTER — Other Ambulatory Visit (HOSPITAL_COMMUNITY): Payer: Self-pay

## 2024-01-30 MED ORDER — TIRZEPATIDE 2.5 MG/0.5ML ~~LOC~~ SOAJ: 2.5000 mg | SUBCUTANEOUS | 5 refills | Status: AC

## 2024-01-30 NOTE — Addendum Note (Signed)
 Addended by: JOHNNY SENIOR A on: 01/30/2024 05:18 PM   Modules accepted: Orders

## 2024-01-30 NOTE — Telephone Encounter (Signed)
 I sent in the [facility]

## 2024-02-02 ENCOUNTER — Telehealth: Payer: Self-pay

## 2024-02-02 NOTE — Telephone Encounter (Signed)
 Pharmacy Patient Advocate Encounter   Received notification from CoverMyMeds that prior authorization for Mounjaro 2.5MG /0.5ML auto-injectors is required/requested.   Insurance verification completed.   The patient is insured through [facility] .   Ozempic/Mounjaro is approved exclusively as an adjunct to diet and exercise to improve glycemic control in adults with type 2 diabetes mellitus. A review of patient's medical chart reveals no documented diagnosis of type 2 diabetes or an A1C indicative of diabetes. Therefore, they do not currently meet the criteria for prior authorization of this medication. If clinically appropriate, alternative options such as Saxenda, Zepbound, or Wegovy  may be considered for this patient.
